# Patient Record
Sex: Female | Born: 1985 | Race: White | Hispanic: No | Marital: Married | Smoking: Never smoker
Health system: Southern US, Community
[De-identification: ages and names within clinical notes are randomized; demographics above are authoritative.]

## PROBLEM LIST (undated history)

## (undated) DIAGNOSIS — Z9884 Bariatric surgery status: Secondary | ICD-10-CM

## (undated) DIAGNOSIS — Z3490 Encounter for supervision of normal pregnancy, unspecified, unspecified trimester: Secondary | ICD-10-CM

## (undated) HISTORY — DX: Encounter for supervision of normal pregnancy, unspecified, unspecified trimester: Z34.90

## (undated) HISTORY — DX: Bariatric surgery status: Z98.84

## (undated) HISTORY — PX: TONSILLECTOMY: SUR1361

---

## 2009-05-15 ENCOUNTER — Other Ambulatory Visit: Admission: RE | Admit: 2009-05-15 | Discharge: 2009-05-15 | Payer: Self-pay | Admitting: Obstetrics and Gynecology

## 2009-07-16 ENCOUNTER — Ambulatory Visit (HOSPITAL_COMMUNITY): Admission: RE | Admit: 2009-07-16 | Discharge: 2009-07-16 | Payer: Self-pay | Admitting: General Surgery

## 2010-09-11 ENCOUNTER — Encounter: Payer: 59 | Attending: General Surgery | Admitting: *Deleted

## 2010-09-11 DIAGNOSIS — Z713 Dietary counseling and surveillance: Secondary | ICD-10-CM | POA: Insufficient documentation

## 2010-09-11 DIAGNOSIS — Z01818 Encounter for other preprocedural examination: Secondary | ICD-10-CM | POA: Insufficient documentation

## 2010-10-23 ENCOUNTER — Other Ambulatory Visit: Payer: Self-pay | Admitting: Adult Health

## 2010-10-23 ENCOUNTER — Other Ambulatory Visit (HOSPITAL_COMMUNITY)
Admission: RE | Admit: 2010-10-23 | Discharge: 2010-10-23 | Disposition: A | Discharge status: Home / Self Care | Payer: Self-pay | Source: Ambulatory Visit | Attending: Obstetrics and Gynecology | Admitting: Obstetrics and Gynecology

## 2010-10-23 DIAGNOSIS — Z01419 Encounter for gynecological examination (general) (routine) without abnormal findings: Secondary | ICD-10-CM | POA: Insufficient documentation

## 2010-10-23 DIAGNOSIS — Z113 Encounter for screening for infections with a predominantly sexual mode of transmission: Secondary | ICD-10-CM | POA: Insufficient documentation

## 2011-02-12 ENCOUNTER — Encounter: Payer: 59 | Attending: General Surgery

## 2011-02-12 DIAGNOSIS — Z713 Dietary counseling and surveillance: Secondary | ICD-10-CM | POA: Insufficient documentation

## 2011-02-12 DIAGNOSIS — Z09 Encounter for follow-up examination after completed treatment for conditions other than malignant neoplasm: Secondary | ICD-10-CM | POA: Insufficient documentation

## 2011-02-12 DIAGNOSIS — Z9884 Bariatric surgery status: Secondary | ICD-10-CM | POA: Insufficient documentation

## 2011-02-12 NOTE — Progress Notes (Signed)
  Pre-Operative Nutrition Class  Patient was seen on 02/12/11 for Pre-Operative Nutrition education at the [facility].   Surgery date: 02/13/2011 Surgery type: Gastric Bypass  Weight today: 316.8lbs Weight change: 8.1 lb gain Total weight lost: n/a BMI: 44.2%  Samples given per MNT protocol: Bariatric Advantage Multivitamin Lot # [id number] Exp:12/12  Bariatric Advantage Calcium Citrate Lot # [id number] Exp: 10/13  Celebrate Vitamins Multivitamin Lot # [id number] Exp:9/13  Celebrate Vitamins Calcium Citrate  Lot # [id number] Exp: 4/13  The following the learning objective met by your patient during this course:   Identifies Pre-Op Dietary Goals and will begin 2 weeks pre-operatively   Identifies appropriate sources of fluids and proteins   States protein recommendations and appropriate sources pre and post-operatively  Identifies Post-Operative Dietary Goals and will follow for 2 weeks post-operatively  Identifies appropriate multivitamin and calcium sources post-operatively  Describes the need for physical activity post-operatively and will follow MD recommendations  States when to call healthcare provider regarding medication questions or post-operative complications  Follow-Up Plan: Patient will follow-up at [facility] 2 weeks post operatively for diet advancement per MD.

## 2011-02-19 ENCOUNTER — Ambulatory Visit: Payer: 59

## 2011-02-26 ENCOUNTER — Encounter (INDEPENDENT_AMBULATORY_CARE_PROVIDER_SITE_OTHER): Payer: Self-pay | Admitting: General Surgery

## 2011-02-27 ENCOUNTER — Ambulatory Visit (INDEPENDENT_AMBULATORY_CARE_PROVIDER_SITE_OTHER): Payer: 59 | Admitting: General Surgery

## 2011-02-27 NOTE — Progress Notes (Signed)
Patient returns for a preop visit prior to scheduled laparoscopic Roux-en-Y gastric bypass. We reviewed all her studies. There were no concerns in the site for nutrition evaluation. Gallbladder ultrasound was negative. EKG and chest x-ray were unremarkable. Bone density was normal. Lab work was unremarkable .  We reviewed the consent form and all her questions were answered.

## 2011-02-27 NOTE — Patient Instructions (Signed)
Stick closely to the preop diet. Call as needed for questions.

## 2011-03-02 ENCOUNTER — Other Ambulatory Visit (INDEPENDENT_AMBULATORY_CARE_PROVIDER_SITE_OTHER): Payer: Self-pay | Admitting: General Surgery

## 2011-03-02 ENCOUNTER — Encounter (HOSPITAL_COMMUNITY): Payer: 59

## 2011-03-02 LAB — COMPREHENSIVE METABOLIC PANEL
ALT: 15 U/L (ref 0–35)
Albumin: 4 g/dL (ref 3.5–5.2)
Alkaline Phosphatase: 70 U/L (ref 39–117)
Chloride: 102 mEq/L (ref 96–112)
Potassium: 3.9 mEq/L (ref 3.5–5.1)
Sodium: 137 mEq/L (ref 135–145)
Total Bilirubin: 0.6 mg/dL (ref 0.3–1.2)
Total Protein: 7.9 g/dL (ref 6.0–8.3)

## 2011-03-02 LAB — CBC
HCT: 42.2 % (ref 36.0–46.0)
Hemoglobin: 14.2 g/dL (ref 12.0–15.0)
MCHC: 33.6 g/dL (ref 30.0–36.0)
RDW: 12.9 % (ref 11.5–15.5)
WBC: 7.2 10*3/uL (ref 4.0–10.5)

## 2011-03-02 LAB — DIFFERENTIAL
Basophils Absolute: 0 10*3/uL (ref 0.0–0.1)
Basophils Relative: 0 % (ref 0–1)
Lymphocytes Relative: 19 % (ref 12–46)
Monocytes Absolute: 0.5 10*3/uL (ref 0.1–1.0)
Monocytes Relative: 7 % (ref 3–12)
Neutro Abs: 5.2 10*3/uL (ref 1.7–7.7)
Neutrophils Relative %: 72 % (ref 43–77)

## 2011-03-09 ENCOUNTER — Inpatient Hospital Stay (HOSPITAL_COMMUNITY)
Admission: RE | Admit: 2011-03-09 | Discharge: 2011-03-11 | DRG: 621 | Disposition: A | Discharge status: Home / Self Care | Payer: 59 | Source: Ambulatory Visit | Attending: General Surgery | Admitting: General Surgery

## 2011-03-09 DIAGNOSIS — Z6841 Body Mass Index (BMI) 40.0 and over, adult: Secondary | ICD-10-CM

## 2011-03-09 DIAGNOSIS — Z01812 Encounter for preprocedural laboratory examination: Secondary | ICD-10-CM

## 2011-03-09 HISTORY — PX: OTHER SURGICAL HISTORY: SHX169

## 2011-03-09 HISTORY — PX: ROUX-EN-Y PROCEDURE: SUR1287

## 2011-03-09 LAB — HEMOGLOBIN AND HEMATOCRIT, BLOOD: HCT: 38.8 % (ref 36.0–46.0)

## 2011-03-10 ENCOUNTER — Inpatient Hospital Stay (HOSPITAL_COMMUNITY): Payer: 59

## 2011-03-10 LAB — CBC
HCT: 36.5 % (ref 36.0–46.0)
Hemoglobin: 12 g/dL (ref 12.0–15.0)
MCH: 29.5 pg (ref 26.0–34.0)
MCV: 89.7 fL (ref 78.0–100.0)
Platelets: 198 10*3/uL (ref 150–400)
RBC: 4.07 MIL/uL (ref 3.87–5.11)
WBC: 12 10*3/uL — ABNORMAL HIGH (ref 4.0–10.5)

## 2011-03-10 LAB — DIFFERENTIAL
Eosinophils Absolute: 0 10*3/uL (ref 0.0–0.7)
Lymphocytes Relative: 10 % — ABNORMAL LOW (ref 12–46)
Lymphs Abs: 1.2 10*3/uL (ref 0.7–4.0)
Monocytes Relative: 9 % (ref 3–12)
Neutro Abs: 9.6 10*3/uL — ABNORMAL HIGH (ref 1.7–7.7)
Neutrophils Relative %: 81 % — ABNORMAL HIGH (ref 43–77)

## 2011-03-10 MED ORDER — IOHEXOL 300 MG/ML  SOLN
50.0000 mL | Freq: Once | INTRAMUSCULAR | Status: AC | PRN
  Administered 2011-03-10: 50 mL via ORAL

## 2011-03-11 LAB — GLUCOSE, CAPILLARY
Glucose-Capillary: 106 mg/dL — ABNORMAL HIGH (ref 70–99)
Glucose-Capillary: 112 mg/dL — ABNORMAL HIGH (ref 70–99)

## 2011-03-11 LAB — DIFFERENTIAL
Basophils Absolute: 0 10*3/uL (ref 0.0–0.1)
Basophils Relative: 0 % (ref 0–1)
Eosinophils Absolute: 0.1 10*3/uL (ref 0.0–0.7)
Monocytes Relative: 11 % (ref 3–12)
Neutrophils Relative %: 69 % (ref 43–77)

## 2011-03-11 LAB — CBC
MCH: 29.9 pg (ref 26.0–34.0)
MCHC: 33 g/dL (ref 30.0–36.0)
Platelets: 162 10*3/uL (ref 150–400)
RBC: 3.88 MIL/uL (ref 3.87–5.11)

## 2011-03-15 NOTE — Op Note (Signed)
Rebecca [name], Rebecca [name]               ACCOUNT NO.:  [id number]  MEDICAL RECORD NO.:  [id number]  LOCATION:  1536                         FACILITY:  [facility], [place]  PHYSICIAN:  Sharlet Salina T. Beverlyann Broxterman, M.D.DATE OF BIRTH:  07/08/1986  DATE OF PROCEDURE:  03/09/2011 DATE OF DISCHARGE:                              OPERATIVE REPORT   PREOPERATIVE DIAGNOSIS:  Morbid obesity.  POSTOPERATIVE DIAGNOSIS:  Morbid obesity.  SURGICAL PROCEDURE:  Laparoscopic Roux-en-Y gastric bypass.  SURGEON:  Lorne Skeens. Iaan Oregel, M.D.  ASSISTANT:  Dr. Luretha Murphy.  ANESTHESIA:  General.  BRIEF HISTORY:  The patient is a 25 year old female with a history of progressive morbid obesity that has been unresponsive to multiple attempts with medical management.  She presents today with a weight of 306 pounds, BMI of 41.6 without other identified comorbidities.  After extensive preop discussion workup detailed elsewhere, we have elected proceed with laparoscopic Roux-en-Y gastric bypass for treatment of morbid obesity.  DESCRIPTION OF OPERATION:  The patient underwent mechanical bowel prep at home.  She was brought to the operating room, placed in supine position on the operating table, and general endotracheal anesthesia was induced.  She received preoperative broad-spectrum IV antibiotics and subcutaneous heparin.  PAS were in place.  Correct patient procedure were verified.  Local anesthesia was infiltrated at the trocar sites. Access was gained with a 12-mm Optiview trocar in the left subcostal space midclavicular line without difficulty and pneumoperitoneum established.  There was no evidence of trocar injury.  Under direct vision, a 12-mm trocar was placed laterally in the right upper quadrant, a 12-mm trocar in the right upper quadrant midclavicular line, and a 12- mm trocar just adjacent to the umbilicus on the left of the camera port. Additional 5-mm trocar was placed in the left flank.  Liver and bowel all  appeared normal.  The ligament of Treitz was identified and a 40 cm biliopancreatic limb was carefully measured.  At this point, the bowel was divided with a single firing of the white load Echelon 60-mm stapler.  The mesentery was divided the short distance further with the Harmonic scalpel.  A Penrose drain was sutured to the end of the Roux limb for later identification.  A 100-cm Roux limb was incidentally measured.  At this point, a side-to-side jejunostomy was created near the end of the biliopancreatic limb and the side of the Roux limb with single firing of the 45-mm white load stapler.  Staple line was intact without bleeding.  The common enterotomy was closed at either ends with a running 2-0 Vicryl.  The mesenteric defect was exposed, was closed with a running 2-0 silk.  Tisseel was used to close the suture staple line against the jejunostomy.  The patient was then placed in steep reverse Trendelenburg position and at 5-mm subxiphoid site, the [facility] retractor was placed, the left lobe of liver elevated with the exposure of the hiatus of the stomach.  Approximately 4 cm, the gastric pouch was measured along the lesser curve.  The peritoneum along the lesser curve was incised with a Harmonic scalpel and careful dissection was carried along the gastric wall at right angles toward the lesser sac and with dissection with Harmonic  scalpel and careful blunt dissection along the gastric wall of free lesser sac was entered.  The angle of His was mobilized, dividing the peritoneal attachments back toward the crus. With all tubes removed from the stomach, the initial firing across the stomach at right angle with the gold load 60-mm Echelon stapler was performed for about 4 cm.  Following this, 2 further blue load 6-mm stapler firings were used to create a tubular pouch, completed the pouch after dissecting the area of the angle of His.  There was a tiny outpouching of the gastric remnant  that appeared possibly devascularized, this was excised with a single firing of the 45-mm blue load stapler, and was removed.  A Ewald tube had been passed down through the EG junction prior to the final couple of firings to the pouch to protect this, remained widely patent.  The staple line of the gastric remnant was then oversewn with a running 2-0 silk.  Following this, the Roux limb was brought up in an antecolic fashion with candy cane facing the patient's left and there was no undue tension whatsoever.  The gastrojejunostomy was then created with an initial posterior row of running 2-0 Vicryl between the Roux limb and the staple line of the gastric pouch.  The Ewald tube was then passed down into the pouch, this was then extended into the pouch.  Enterotomies were created with the Harmonic scalpel, the pouch, and the Roux limb.  A 45-mm blue load stapler was then used to create approximately 2-2.5 cm anastomosis. The staple line was intact without bleeding.  The common enterotomy was then closed from either end with running 2-0 Vicryl.  Following this, Ewald tube was passed back down to the anastomosis, an outer row of running of 2-0 Vicryl seromuscular stitch was placed.  Anastomosis appeared obviously patent under no tension and viable.  The Ewald tube was removed.  Following this, Dr. Daphine Deutscher performed a upper endoscopy and under saline irrigation, with the pouch temporarily distended with air, there was no evidence of leak.  Pouch desufflated.  The scope removed. The Vonita Moss defect was closed suturing the mesentery of the transverse colon over the edge of the Roux limb mesentery with a running 2-0 silk, that was carefully inspected for bleeding or any evidence of trocar injury and everything looked fine.  The Nathan retractor was removed under direct vision after cutting the suture staple lines of the gastrojejunostomy with Tisseel.  All trocars removed after desufflating the  abdomen.  Skin incisions were closed with subcuticular Monocryl and Dermabond.  Sponge, needle, and instrument counts were correct.  The patient taken to Recovery in good condition.     Lorne Skeens. Arjan Strohm, M.D.     Tory Emerald  D:  03/09/2011  T:  03/10/2011  Job:  [id number]  Electronically Signed by Glenna Fellows M.D. on 03/15/2011 09:52:57 AM

## 2011-03-24 ENCOUNTER — Encounter: Payer: 59 | Attending: General Surgery | Admitting: *Deleted

## 2011-03-24 DIAGNOSIS — Z713 Dietary counseling and surveillance: Secondary | ICD-10-CM | POA: Insufficient documentation

## 2011-03-24 DIAGNOSIS — Z09 Encounter for follow-up examination after completed treatment for conditions other than malignant neoplasm: Secondary | ICD-10-CM | POA: Insufficient documentation

## 2011-03-24 DIAGNOSIS — Z9884 Bariatric surgery status: Secondary | ICD-10-CM | POA: Insufficient documentation

## 2011-03-24 NOTE — Patient Instructions (Signed)
Patient to follow Phase 3A-Soft, High Protein Diet and follow-up at [facility] in 6 weeks for 2 months post-op nutrition visit for diet advancement. 

## 2011-03-24 NOTE — Progress Notes (Signed)
  2 Week Post-Operative Nutrition Class  Patient was seen on 03/24/2011 for Post-Operative Nutrition education at the [facility].   Surgery date: 02/13/11 Surgery type: Gastric Bypass   Weight today: 283.8 lbs (316.8) Weight change: 33 lbs Total weight lost: 33 lbs total BMI: 39.7  The following the learning objective met the patient during this course:   Identifies Phase 3A (Soft, High Proteins) Dietary Goals and will begin from 2 weeks post-operatively to 2 months post-operatively   Identifies appropriate sources of fluids and proteins   States protein recommendations and appropriate sources post-operatively  Identifies the need for appropriate texture modifications, mastication, and bite sizes when consuming solids  Identifies appropriate multivitamin and calcium sources post-operatively  Describes the need for physical activity post-operatively and will follow MD recommendations  States when to call healthcare provider regarding medication questions or post-operative complications  Follow-Up Plan: Patient will follow-up at [facility] in 6 weeks for 2 months post-op nutrition visit for diet advancement per MD.

## 2011-03-26 ENCOUNTER — Encounter (INDEPENDENT_AMBULATORY_CARE_PROVIDER_SITE_OTHER): Payer: Self-pay | Admitting: General Surgery

## 2011-03-26 ENCOUNTER — Ambulatory Visit (INDEPENDENT_AMBULATORY_CARE_PROVIDER_SITE_OTHER): Payer: 59 | Admitting: General Surgery

## 2011-03-26 VITALS — BP 124/83 | HR 73 | Temp 98.8°F | Ht 72.0 in | Wt 283.4 lb

## 2011-03-26 DIAGNOSIS — Z9889 Other specified postprocedural states: Secondary | ICD-10-CM

## 2011-03-26 NOTE — Patient Instructions (Signed)
Gradually begin exercise. Call as needed for concerns.

## 2011-03-26 NOTE — Progress Notes (Signed)
Patient returns just over 2 weeks following laparoscopic Roux-en-Y gastric bypass for morbid obesity. She is getting along very well. She has just started her solid diet. She has not had any abdominal pain, vomiting, fever or other complaints. She is beginning her exercise and plans to go back to work next week.  On examination she is afebrile and vital signs are all within normal limits. She appears well. Abdomen is soft and nontender and wounds healing nicely.  She is doing very well postoperatively and will return in one month.

## 2011-04-10 NOTE — H&P (Signed)
  NAMECAYDEE, TALKINGTON               ACCOUNT NO.:  [id number]  MEDICAL RECORD NO.:  [id number]  LOCATION:                                 FACILITY:  PHYSICIAN:  Lorne Skeens. Raunel Dimartino, M.D.DATE OF BIRTH:  09/20/1985  DATE OF ADMISSION:  03/09/2011 DATE OF DISCHARGE:                             HISTORY & PHYSICAL   CHIEF COMPLAINT:  Morbid obesity.  HISTORY OF PRESENT ILLNESS:  The patient is a 25 year old female with a progressive history of morbid obesity, unresponsive to multiple efforts at medical management.  After a thorough evaluation and workup as an outpatient, she is admitted electively for laparoscopic Roux-en-Y gastric bypass.  PAST MEDICAL HISTORY:  Surgery significant only for tonsillectomy. Medically, she is not treated for any chronic illnesses.  MEDICATIONS:  None.  ALLERGIES:  None.  SOCIAL HISTORY:  She is working and going to school part-time.  No cigarettes or alcohol.  Single.  FAMILY HISTORY:  Father deceased, mother without serious illness.  REVIEW OF SYSTEMS:  Entirely negative except for some joint discomfort and fatigue.  PHYSICAL EXAM:  VITAL SIGNS:  She is 5 feet 11 inches, 317 pounds, BMI of 44.4.  Vital signs all within normal limits. GENERAL:  Significant for morbid obesity. SKIN:  Warm, dry.  No rash or infection. HEENT:  No masses or thyromegaly.  Sclerae nonicteric. LYMPH NODES:  No cervical, subclavicular or inguinal nodes palpable. LUNGS:  Clear without wheezing or increased work of breathing. CARDIOVASCULAR:  Regular rate and rhythm.  No murmurs. ABDOMEN:  Soft, nontender.  No masses.  No organomegaly.  No hernias. EXTREMITIES:  No joint swelling, deformity or edema.  ASSESSMENT AND PLAN:  25 year old female with progressive morbid obesity who is admitted electively for laparoscopic Roux-en-Y gastric bypass.     Lorne Skeens. Philip Kotlyar, M.D.     Tory Emerald  D:  03/31/2011  T:  03/31/2011  Job:  [id number]  Electronically Signed by  Glenna Fellows M.D. on 04/10/2011 05:51:17 PM

## 2011-04-10 NOTE — Discharge Summary (Signed)
  Rebecca [name], Rebecca [name]               ACCOUNT NO.:  [id number]  MEDICAL RECORD NO.:  [id number]  LOCATION:  1536                         FACILITY:  [facility]  PHYSICIAN:  Sharlet Salina T. Gerlene Glassburn, M.D.DATE OF BIRTH:  1986/05/10  DATE OF ADMISSION:  03/09/2011 DATE OF DISCHARGE:  03/11/2011                              DISCHARGE SUMMARY   DISCHARGE DIAGNOSIS:  Morbid obesity.  OPERATIONS AND PROCEDURES:  Laparoscopic Roux-en-Y gastric bypass, Dr. Johna Sheriff, March 09, 2011.  HISTORY:  This patient is an otherwise healthy morbidly obese 25 year old female electively admitted for laparoscopic Roux-en-Y gastric bypass.  PHYSICAL EXAMINATION:  Unremarkable except for morbid obesity.  HOSPITAL COURSE:  On the morning of her admission, she underwent uneventful laparoscopic Roux-en-Y gastric bypass.  Her hospital course was entirely uncomplicated.  Gastrografin swallow on the first postoperative day was unremarkable, and she was begun on oral fluids. On the second postoperative day, she was tolerating a protein shake. Abdomen is soft and nontender, wound is healing well.  Vital signs are stable.  She is discharged home at this time.  Discharge medications are Tylox as needed for pain.  Followup in my office in 2 to 3 weeks.     Lorne Skeens. Viha Kriegel, M.D.     Tory Emerald  D:  03/31/2011  T:  03/31/2011  Job:  [id number]  Electronically Signed by Glenna Fellows M.D. on 04/10/2011 05:51:09 PM

## 2011-05-05 ENCOUNTER — Encounter: Payer: 59 | Attending: General Surgery | Admitting: *Deleted

## 2011-05-05 DIAGNOSIS — Z09 Encounter for follow-up examination after completed treatment for conditions other than malignant neoplasm: Secondary | ICD-10-CM | POA: Insufficient documentation

## 2011-05-05 DIAGNOSIS — Z713 Dietary counseling and surveillance: Secondary | ICD-10-CM | POA: Insufficient documentation

## 2011-05-05 DIAGNOSIS — Z9884 Bariatric surgery status: Secondary | ICD-10-CM | POA: Insufficient documentation

## 2011-05-05 NOTE — Patient Instructions (Signed)
Goals:  Follow Phase 3B: High Protein + Non-Starchy Vegetables  Eat 3-6 small meals/snacks, every 3-5 hrs  Increase lean protein foods to meet 80g goal  Increase fluid intake to 64oz +  Avoid drinking 15 minutes before, during and 30 minutes after eating  Aim for >30 min of physical activity daily 

## 2011-05-05 NOTE — Progress Notes (Signed)
  Follow-up visit: 8 Weeks Post-Operative Gastric Bypass Surgery  Medical Nutrition Therapy:  Appt start time: 0730 end time:  0800.  Assessment:  Primary concerns today: post-operative bariatric surgery nutrition management. Rebecca [name] reports that she is doing well today with her nutrition s/p RNY GBP. She is eating small portions of protein 4-6 times per day. She notes that she is tired of some of eating the same foods and is ready for some variety in her diet.  Weight today: 264.3 lb Weight change: 19.5 lbs Total weight lost: 52.5 lbs total BMI: 36.9 Weight goal: 200-215  24-hr recall:  B (9 AM): Yogurt cup ([employer]) Snk (11  AM): String cheese   L (2 PM): [place] burger patty OR 1 egg w/ cheese (2-3 oz) Snk (3-4 PM): String cheese OR Babybel cheese  D (8:30 PM): 3 oz steak 1 oz mushrooms OR Salmon (3 oz) w/ green beans Snk (9 PM): SF popsickle  Fluid intake: water or crystal light (24-32 oz) Estimated total protein intake: 60-65g   Medications: Taking birth control Supplementation: Taking solid pills for MVI and taking liquid Calcium Citrate (1500mg )  Using straws: No Drinking while eating: No Hair loss: No Carbonated beverages: No N/V/D/C: No Dumping syndrome: No  Recent physical activity:  Walks 3 times/week for 45 minutes  Progress Towards Goal(s):  In progress.  Handouts given during visit include:  Phase 3B- High Protein and Non-Starchy Vegetables   Nutritional Diagnosis:  [place]-5.3 Inadequate protein intake As related to small portions .  As evidenced by pt meeting <75% of estimated protein requirements.    Intervention:  Nutrition education.  Monitoring/Evaluation:  Dietary intake, exercise, lap band fills, and body weight. Follow up in 4-6 weeks for 3-4 month post-op visit.

## 2011-05-07 ENCOUNTER — Ambulatory Visit (INDEPENDENT_AMBULATORY_CARE_PROVIDER_SITE_OTHER): Payer: 59 | Admitting: General Surgery

## 2011-05-07 ENCOUNTER — Encounter (INDEPENDENT_AMBULATORY_CARE_PROVIDER_SITE_OTHER): Payer: Self-pay | Admitting: General Surgery

## 2011-05-07 VITALS — BP 130/88 | HR 80 | Temp 98.8°F | Resp 16 | Ht 72.0 in | Wt 265.6 lb

## 2011-05-07 DIAGNOSIS — Z09 Encounter for follow-up examination after completed treatment for conditions other than malignant neoplasm: Secondary | ICD-10-CM

## 2011-05-07 NOTE — Progress Notes (Signed)
Patient returns for followup now just over 6 weeks following laparoscopic Roux-en-Y gastric bypass for morbid obesity. She continues to get along very well. She denies any abdominal or GI complaints. She is exercising regularly and has been advanced to solid high-protein diet and is tolerating this well.  On examination weight is 265 pounds representing a 40 pound weight loss from her surgical date. BMI is down to 35.9. Abdomen is soft and nontender. Wounds all well healed. She generally looks well.  Assessment and plan: Doing very well following laparoscopic Roux-en-Y gastric bypass. I'll see her back in another 6 weeks.

## 2011-06-16 ENCOUNTER — Encounter: Payer: 59 | Admitting: *Deleted

## 2011-06-17 ENCOUNTER — Encounter: Payer: 59 | Attending: General Surgery | Admitting: *Deleted

## 2011-06-17 ENCOUNTER — Encounter: Payer: Self-pay | Admitting: *Deleted

## 2011-06-17 DIAGNOSIS — Z09 Encounter for follow-up examination after completed treatment for conditions other than malignant neoplasm: Secondary | ICD-10-CM | POA: Insufficient documentation

## 2011-06-17 DIAGNOSIS — Z9884 Bariatric surgery status: Secondary | ICD-10-CM | POA: Insufficient documentation

## 2011-06-17 DIAGNOSIS — Z713 Dietary counseling and surveillance: Secondary | ICD-10-CM | POA: Insufficient documentation

## 2011-06-17 NOTE — Progress Notes (Signed)
   Follow-up visit: 12 Weeks Post-Operative Gastric Bypass Surgery  Medical Nutrition Therapy:  Appt start time: 0805 end time:  0835.  Assessment:  Primary concerns today: post-operative bariatric surgery nutrition management. Rebecca [name] has been doing well with nutrition s/p surgery. No nutrition related problems reported.  Weight today: 247.4 lbs Weight change: 16.9 Total weight lost: 69.4 lbs BMI: 33.6% Weight goal: 200-215 lbs  Surgery date: 02/13/11 Start weight at [facility]: 316.8 lbs  24-hr recall:  B (9-10 AM): Yogurt cup OR Ham steak w/ 1 egg white (2 oz total) OR Protein shake Snk (AM): No snacking   L (12-1 PM): Grilled chicken salad (light dressings) Snk (3-4 PM): 1/2 Protein bar, 1 string cheese  D (6-8 PM): Grilled chicken salad (light dressings) Snk (PM): SF popsicle  Fluid intake: water, coffee (splenda), protein shake = 48-64 oz Estimated total protein intake: 60-70 g  Medications: No changes Supplementation: Taking regularly, no problems reported  Using straws: No Drinking while eating: No Hair loss: No Carbonated beverages: No N/V/D/C: No Dumping syndrome: None reported  Recent physical activity:  Walking, 30 minutes, 3 times/week. Pt just joined the [employer].  Progress Towards Goal(s):  In progress.  Handouts given during visit include:  Carb Counting Card   Nutritional Diagnosis:  [place]-3.3 Overweight/obesity As related to recent gastric bypass surgery.  As evidenced by pt following gastric bypass nutrition guidelines for continued weight loss.    Intervention:  Nutrition education.  Monitoring/Evaluation:  Dietary intake, exercise, and body weight. Follow up in 3 months for 6 month post-op visit.

## 2011-06-17 NOTE — Patient Instructions (Signed)
Goals:  Follow Phase 3B: High Protein + Non-Starchy Vegetables  Eat 3-6 small meals/snacks, every 3-5 hrs  Increase lean protein foods to meet 60-80g goal  Increase fluid intake to 64oz +  Add 15 grams of carbohydrate (fruit, whole grain, starchy vegetable) with meals  Avoid drinking 15 minutes before, during and 30 minutes after eating  Aim for >30 min of physical activity daily  

## 2011-06-19 ENCOUNTER — Ambulatory Visit (INDEPENDENT_AMBULATORY_CARE_PROVIDER_SITE_OTHER): Payer: 59 | Admitting: General Surgery

## 2011-06-19 ENCOUNTER — Encounter (INDEPENDENT_AMBULATORY_CARE_PROVIDER_SITE_OTHER): Payer: Self-pay | Admitting: General Surgery

## 2011-06-19 DIAGNOSIS — K912 Postsurgical malabsorption, not elsewhere classified: Secondary | ICD-10-CM

## 2011-06-19 LAB — CBC WITH DIFFERENTIAL/PLATELET
Basophils Absolute: 0.1 10*3/uL (ref 0.0–0.1)
HCT: 42 % (ref 36.0–46.0)
Lymphocytes Relative: 20 % (ref 12–46)
Neutro Abs: 4.9 10*3/uL (ref 1.7–7.7)
Platelets: 223 10*3/uL (ref 150–400)
RBC: 4.55 MIL/uL (ref 3.87–5.11)
RDW: 15 % (ref 11.5–15.5)
WBC: 7.1 10*3/uL (ref 4.0–10.5)

## 2011-06-19 LAB — COMPREHENSIVE METABOLIC PANEL
ALT: 16 U/L (ref 0–35)
AST: 19 U/L (ref 0–37)
Albumin: 4.4 g/dL (ref 3.5–5.2)
Calcium: 9.5 mg/dL (ref 8.4–10.5)
Chloride: 105 mEq/L (ref 96–112)
Potassium: 4.6 mEq/L (ref 3.5–5.3)
Total Protein: 6.8 g/dL (ref 6.0–8.3)

## 2011-06-19 LAB — IRON AND TIBC: TIBC: 277 ug/dL (ref 250–470)

## 2011-06-19 LAB — VITAMIN B12: Vitamin B-12: 721 pg/mL (ref 211–911)

## 2011-06-19 NOTE — Progress Notes (Signed)
History: Patient returns for followup now 3 months post laparoscopic Roux-en-Y gastric bypass for morbid obesity. She reports she is doing well. Energy is good. She denies any abdominal pain or GI complaints. She has good progressive weight loss now over 60 pounds total for preop.  Exam: General he appears well. Abdomen is soft and nontender and wounds well healed without hernias. Weight is 243 pounds BMI 32.96.  Assessment and plan: Doing very well following laparoscopic Roux-en-Y gastric bypass without complication identified and good progressive weight loss. We reviewed her diet and exercise plans withdrawal appropriate. We'll obtain routine lab work today. Return in 3 months.

## 2011-06-23 ENCOUNTER — Telehealth (INDEPENDENT_AMBULATORY_CARE_PROVIDER_SITE_OTHER): Payer: Self-pay

## 2011-06-23 NOTE — Telephone Encounter (Signed)
Left message for patient

## 2011-09-17 ENCOUNTER — Encounter: Payer: Self-pay | Admitting: *Deleted

## 2011-09-17 ENCOUNTER — Encounter: Payer: 59 | Attending: General Surgery | Admitting: *Deleted

## 2011-09-17 VITALS — Ht 72.0 in | Wt 211.8 lb

## 2011-09-17 DIAGNOSIS — Z9884 Bariatric surgery status: Secondary | ICD-10-CM | POA: Insufficient documentation

## 2011-09-17 DIAGNOSIS — Z09 Encounter for follow-up examination after completed treatment for conditions other than malignant neoplasm: Secondary | ICD-10-CM | POA: Insufficient documentation

## 2011-09-17 DIAGNOSIS — Z713 Dietary counseling and surveillance: Secondary | ICD-10-CM | POA: Insufficient documentation

## 2011-09-17 NOTE — Progress Notes (Signed)
   Follow-up visit: 6 Months Post-Operative Gastric Bypass Surgery  Medical Nutrition Therapy:  Appt start time: 0810 end time:  0835.  Assessment:  Primary concerns today: post-operative bariatric surgery nutrition management. Rebecca [name] has been doing well with nutrition s/p surgery. No nutrition related problems reported.  Weight today: 211.8 lbs Weight change: 35.9 lbs Total weight lost: 105.3 lbs BMI: 28.8% Weight goal: 200-215 lbs  Surgery date: 02/13/11 Start weight at [facility]: 316.8 lbs  24-hr recall:  B (9-10 AM): Yogurt cup OR Ham steak w/ 1 egg white (2 oz total) OR Protein shake Snk (AM): No snacking   L (12-1 PM): Grilled chicken salad (light dressings) Snk (3-4 PM): 1/2 Protein bar, 1 string cheese  D (6-8 PM): Grilled chicken salad (light dressings) Snk (PM): SF popsicle  Fluid intake: water, coffee (splenda), protein shake = 48-64 oz Estimated total protein intake: 60-75 g  Medications: No changes Supplementation: Taking regularly, no problems reported  Using straws: No Drinking while eating: No Hair loss: Slightly increased Carbonated beverages: No N/V/D/C: Occasional constipation Dumping syndrome: None reported  Recent physical activity:  [employer]; 5-7 times/week for 60 minutes.  Progress Towards Goal(s):  In progress.   Nutritional Diagnosis:  [place]-3.3 Overweight/obesity As related to recent gastric bypass surgery.  As evidenced by pt following gastric bypass nutrition guidelines for continued weight loss.    Intervention:  Nutrition education.  Monitoring/Evaluation:  Dietary intake, exercise, and body weight. Follow up in 6 months for 12 month post-op visit.

## 2011-09-17 NOTE — Patient Instructions (Signed)
Goals:  Follow Phase 3B: High Protein + Non-Starchy Vegetables  Eat 3-6 small meals/snacks, every 3-5 hrs  Increase lean protein foods to meet 60-80g goal  Increase fluid intake to 64oz +  Add 15 grams of carbohydrate (fruit, whole grain, starchy vegetable) with meals  Avoid drinking 15 minutes before, during and 30 minutes after eating  Aim for >30 min of physical activity daily  

## 2011-10-08 ENCOUNTER — Ambulatory Visit (INDEPENDENT_AMBULATORY_CARE_PROVIDER_SITE_OTHER): Payer: 59 | Admitting: General Surgery

## 2011-11-26 ENCOUNTER — Ambulatory Visit (INDEPENDENT_AMBULATORY_CARE_PROVIDER_SITE_OTHER): Payer: 59 | Admitting: General Surgery

## 2011-12-02 ENCOUNTER — Encounter (INDEPENDENT_AMBULATORY_CARE_PROVIDER_SITE_OTHER): Payer: Self-pay | Admitting: General Surgery

## 2012-02-11 ENCOUNTER — Ambulatory Visit: Payer: 59 | Admitting: *Deleted

## 2012-11-23 ENCOUNTER — Telehealth: Payer: Self-pay | Admitting: Adult Health

## 2012-11-23 NOTE — Telephone Encounter (Signed)
Needs sample of birth control pills, will come by Friday.

## 2012-12-08 ENCOUNTER — Ambulatory Visit (INDEPENDENT_AMBULATORY_CARE_PROVIDER_SITE_OTHER): Payer: Self-pay | Admitting: General Surgery

## 2012-12-16 ENCOUNTER — Encounter (INDEPENDENT_AMBULATORY_CARE_PROVIDER_SITE_OTHER): Payer: Self-pay | Admitting: General Surgery

## 2012-12-27 IMAGING — CR Imaging study
1 series · 1 of 1 positions shown · IV contrast (agent unspecified)
Comparison: None.

CLINICAL DATA: Postop gastric bypass.

WATER SOLUBLE UPPER GI SERIES
TECHNIQUE: Single-column upper GI series was performed using water
soluble contrast.
Fluoroscopy Time: 2.9 minutes.
Contrast: 50 ml [id number].

[view not recorded]
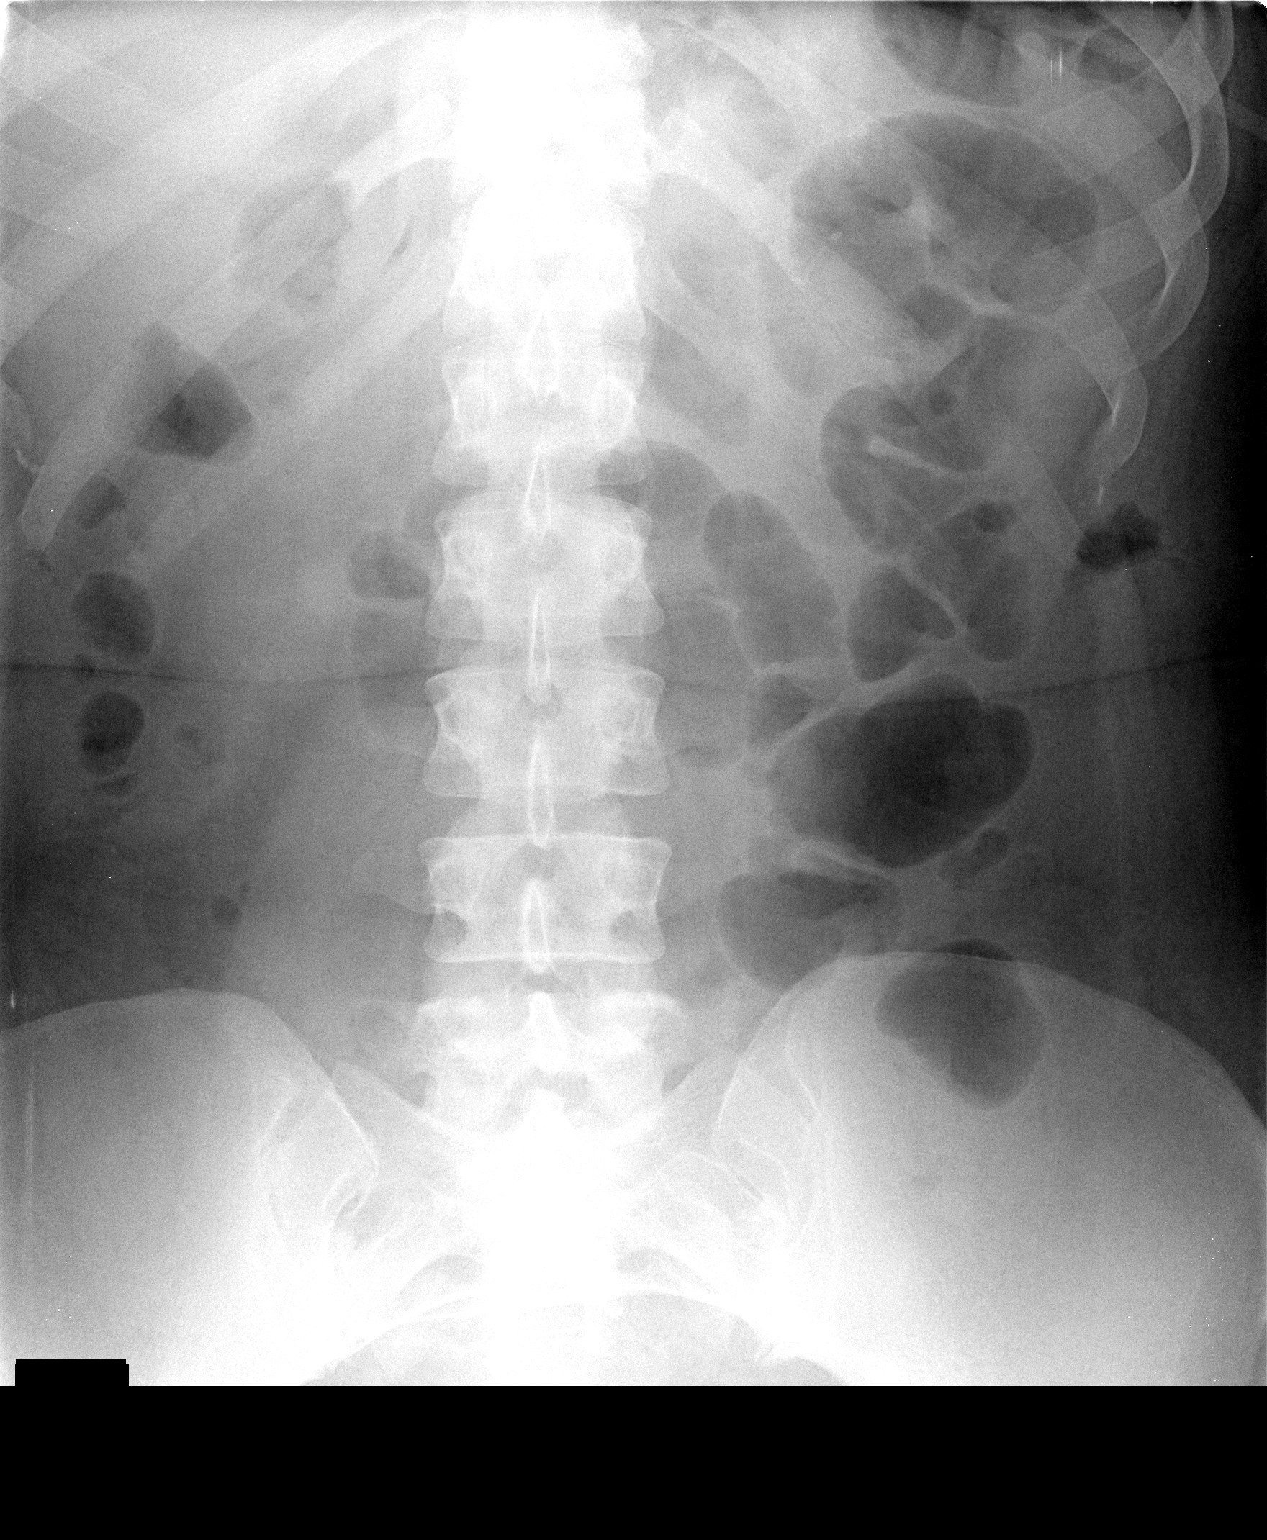

[1 of 1 positions shown; findings below may reference images not displayed]

FINDINGS: Water-soluble study demonstrates normal distal esophagus.
Small gastric pouch noted.  There is prompt emptying into the small
bowel.  No evidence of leak.  The study was continued distally show
no obstruction or leak at the distal anastomosis.
IMPRESSION: Normal postoperative appearance.  No obstruction or leak.

## 2013-02-06 ENCOUNTER — Other Ambulatory Visit: Payer: Self-pay

## 2013-02-06 ENCOUNTER — Emergency Department (HOSPITAL_COMMUNITY)
Admission: EM | Admit: 2013-02-06 | Discharge: 2013-02-06 | Disposition: A | Discharge status: Home / Self Care | Payer: Self-pay | Attending: Emergency Medicine | Admitting: Emergency Medicine

## 2013-02-06 ENCOUNTER — Encounter (HOSPITAL_COMMUNITY): Payer: Self-pay | Admitting: Emergency Medicine

## 2013-02-06 DIAGNOSIS — Z3202 Encounter for pregnancy test, result negative: Secondary | ICD-10-CM | POA: Insufficient documentation

## 2013-02-06 DIAGNOSIS — Z79899 Other long term (current) drug therapy: Secondary | ICD-10-CM | POA: Insufficient documentation

## 2013-02-06 DIAGNOSIS — E860 Dehydration: Secondary | ICD-10-CM | POA: Insufficient documentation

## 2013-02-06 DIAGNOSIS — R61 Generalized hyperhidrosis: Secondary | ICD-10-CM | POA: Insufficient documentation

## 2013-02-06 DIAGNOSIS — R55 Syncope and collapse: Secondary | ICD-10-CM | POA: Insufficient documentation

## 2013-02-06 DIAGNOSIS — Z9884 Bariatric surgery status: Secondary | ICD-10-CM | POA: Insufficient documentation

## 2013-02-06 LAB — COMPREHENSIVE METABOLIC PANEL
ALT: 20 U/L (ref 0–35)
AST: 21 U/L (ref 0–37)
Alkaline Phosphatase: 82 U/L (ref 39–117)
CO2: 27 mEq/L (ref 19–32)
Chloride: 103 mEq/L (ref 96–112)
Creatinine, Ser: 0.74 mg/dL (ref 0.50–1.10)
GFR calc non Af Amer: >90 mL/min (ref >90)
Total Bilirubin: 0.5 mg/dL (ref 0.3–1.2)

## 2013-02-06 LAB — URINALYSIS, ROUTINE W REFLEX MICROSCOPIC
Bilirubin Urine: NEGATIVE
Hgb urine dipstick: NEGATIVE
Nitrite: NEGATIVE
Specific Gravity, Urine: 1.015 (ref 1.005–1.030)
pH: 7.5 (ref 5.0–8.0)

## 2013-02-06 LAB — CBC WITH DIFFERENTIAL/PLATELET
Basophils Absolute: 0 10*3/uL (ref 0.0–0.1)
HCT: 43.3 % (ref 36.0–46.0)
Hemoglobin: 14.7 g/dL (ref 12.0–15.0)
Lymphocytes Relative: 32 % (ref 12–46)
Monocytes Absolute: 0.4 10*3/uL (ref 0.1–1.0)
Neutro Abs: 4.1 10*3/uL (ref 1.7–7.7)
RDW: 12.7 % (ref 11.5–15.5)
WBC: 6.7 10*3/uL (ref 4.0–10.5)

## 2013-02-06 LAB — PREGNANCY, URINE: Preg Test, Ur: NEGATIVE

## 2013-02-06 MED ORDER — SODIUM CHLORIDE 0.9 % IV BOLUS (SEPSIS): 1000.0000 mL | Freq: Once | INTRAVENOUS | Status: DC

## 2013-02-06 MED ORDER — SODIUM CHLORIDE 0.9 % IV BOLUS (SEPSIS)
1000.0000 mL | Freq: Once | INTRAVENOUS | Status: AC
  Administered 2013-02-06: 1000 mL via INTRAVENOUS

## 2013-02-06 NOTE — ED Notes (Signed)
Second 1 liter bag of saline given by Philmore Pali, RN at [phone]. Stopped at 1950.

## 2013-02-06 NOTE — ED Provider Notes (Signed)
History    This chart was scribed for Benny Lennert, MD, by Frederik Pear, ED scribe. The patient was seen in room APA12/APA12 and the patient's care was started at 1633.   CSN: [id number] Arrival date & time 02/06/13  1415  First MD Initiated Contact with Patient 02/06/13 1633     Chief Complaint  Patient presents with  . Dizziness  . Near Syncope   (Consider location/radiation/quality/duration/timing/severity/associated sxs/prior Treatment) Patient is a 27 y.o. female presenting with general illness. The history is provided by the patient and medical records. No language interpreter was used.  Illness Quality:  Dizziness Severity:  Unable to specify Onset quality:  Sudden Duration:  1 day Timing:  Intermittent Progression:  Unable to specify Associated symptoms: no abdominal pain, no chest pain, no congestion, no cough, no diarrhea, no fatigue, no headaches and no rash     HPI Comments: Rebecca [name] is a 27 y.o. female who presents to the Emergency Department complaining of sudden onset, intermittent dizziness that is aggravated by standing for long periods of time and alleviated by nothing with associated near-syncopal episodes and diaphoresis that began this morning. She reports 4 episodes since the symptom began including 2 after she arrived at work at approximately 0800. She denies SOB and cough. She reports she ate some trail mix before work. She has a h/o of gastric bypass, and her mother reports she has not taken her daily vitamins as prescribed for the last 6 months. No monthly period due to [facility].  PCP is Dr. Martyn Ehrich.   History reviewed. No pertinent past medical history. Past Surgical History  Procedure Laterality Date  . Tonsillectomy    . Rny  03/09/2011  . 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 History  Substance Use Topics  . Smoking status: Never Smoker   . Smokeless tobacco: Never Used  . Alcohol Use: 2.4 oz/week    4 Glasses of  wine per week     Comment: occ   OB History   Grav Para Term Preterm Abortions TAB SAB Ect Mult Living                 Review of Systems  Constitutional: Positive for diaphoresis. Negative for appetite change and fatigue.  HENT: Negative for congestion, sinus pressure and ear discharge.   Eyes: Negative for discharge.  Respiratory: Negative for cough.   Cardiovascular: Negative for chest pain.  Gastrointestinal: Negative for abdominal pain and diarrhea.  Genitourinary: Negative for frequency and hematuria.  Musculoskeletal: Negative for back pain.  Skin: Negative for rash.  Neurological: Positive for dizziness. Negative for seizures and headaches.       Near-syncopal episodes  Psychiatric/Behavioral: Negative for hallucinations.    Allergies  Review of patient's allergies indicates no known allergies.  Home Medications   Current Outpatient Rx  Name  Route  Sig  Dispense  Refill  . calcium citrate-vitamin D 200-200 MG-UNIT TABS   Oral   Take 4 tablets by mouth daily.           . cyanocobalamin 500 MCG tablet   Oral   Take 500 mcg by mouth daily.           . Multiple Vitamins-Minerals (MULTIVITAMIN WITH MINERALS) tablet   Oral   Take 1 tablet by mouth daily.           . norethindrone (MICRONOR,CAMILA,ERRIN) 0.35 MG tablet   Oral   Take 1 tablet by mouth  daily.            BP 113/62  Pulse 87  Temp(Src) 98.9 F (37.2 C)  Resp 18  Ht 6' (1.829 m)  Wt 200 lb (90.719 kg)  BMI 27.12 kg/m2  SpO2 100% Physical Exam  Nursing note and vitals reviewed. Constitutional: She is oriented to person, place, and time. She appears well-developed.  HENT:  Head: Normocephalic.  Eyes: Conjunctivae and EOM are normal. No scleral icterus.  Neck: Neck supple. No thyromegaly present.  Cardiovascular: Normal rate and regular rhythm.  Exam reveals no gallop and no friction rub.   No murmur heard. Pulmonary/Chest: No stridor. She has no wheezes. She has no rales. She  exhibits no tenderness.  Abdominal: She exhibits no distension. There is no tenderness. There is no rebound.  Musculoskeletal: Normal range of motion. She exhibits no edema.  Lymphadenopathy:    She has no cervical adenopathy.  Neurological: She is oriented to person, place, and time. Coordination normal.  Skin: No rash noted. No erythema.  Psychiatric: She has a normal mood and affect. Her behavior is normal.    ED Course  Procedures (including critical care time)  DIAGNOSTIC STUDIES: Oxygen Saturation is 100% on room air, normal by my interpretation.    COORDINATION OF CARE:  16:40- Discussed planned course of treatment with the patient, including IV fluids, EKG, CBC with differential, and comprehensive metabolic panel, who is agreeable at this time.  16:45- Medication Orders- sodium chloride 0.9% bolus 1,000 mL- once.   Results for orders placed during the hospital encounter of 02/06/13  CBC WITH DIFFERENTIAL      Result Value Range   WBC 6.7  4.0 - 10.5 K/uL   RBC 4.62  3.87 - 5.11 MIL/uL   Hemoglobin 14.7  12.0 - 15.0 g/dL   HCT 09.8  11.9 - 14.7 %   MCV 93.7  78.0 - 100.0 fL   MCH 31.8  26.0 - 34.0 pg   MCHC 33.9  30.0 - 36.0 g/dL   RDW 82.9  56.2 - 13.0 %   Platelets 199  150 - 400 K/uL   Neutrophils Relative % 61  43 - 77 %   Neutro Abs 4.1  1.7 - 7.7 K/uL   Lymphocytes Relative 32  12 - 46 %   Lymphs Abs 2.1  0.7 - 4.0 K/uL   Monocytes Relative 5  3 - 12 %   Monocytes Absolute 0.4  0.1 - 1.0 K/uL   Eosinophils Relative 1  0 - 5 %   Eosinophils Absolute 0.1  0.0 - 0.7 K/uL   Basophils Relative 0  0 - 1 %   Basophils Absolute 0.0  0.0 - 0.1 K/uL  COMPREHENSIVE METABOLIC PANEL      Result Value Range   Sodium 141  135 - 145 mEq/L   Potassium 3.9  3.5 - 5.1 mEq/L   Chloride 103  96 - 112 mEq/L   CO2 27  19 - 32 mEq/L   Glucose, Bld 98  70 - 99 mg/dL   BUN 13  6 - 23 mg/dL   Creatinine, Ser 8.65  0.50 - 1.10 mg/dL   Calcium 9.0  8.4 - 78.4 mg/dL   Total  Protein 6.7  6.0 - 8.3 g/dL   Albumin 3.7  3.5 - 5.2 g/dL   AST 21  0 - 37 U/L   ALT 20  0 - 35 U/L   Alkaline Phosphatase 82  39 - 117 U/L  Total Bilirubin 0.5  0.3 - 1.2 mg/dL   GFR calc non Af Amer >90  >90 mL/min   GFR calc Af Amer >90  >90 mL/min    Labs Reviewed - No data to display No results found. No diagnosis found.  MDM    The chart was scribed for me under my direct supervision.  I personally performed the history, physical, and medical decision making and all procedures in the evaluation of this patient.Benny Lennert, MD 02/06/13 [phone]

## 2013-02-06 NOTE — ED Notes (Signed)
Pt reports dizziness and near syncope today while at work, she has not been taking her daily vitamins as prescribed. Pt is alert in exam room,able to answer all ?'s, feels weak, and tired.

## 2013-06-01 ENCOUNTER — Telehealth: Payer: Self-pay | Admitting: *Deleted

## 2013-06-01 MED ORDER — NORETHIN-ETH ESTRAD-FE BIPHAS 1 MG-10 MCG / 10 MCG PO TABS: 1.0000 | ORAL_TABLET | Freq: Every day | ORAL | Status: DC

## 2013-06-01 NOTE — Telephone Encounter (Signed)
4 samples lo loestrin given lot [id number] A exp 10/15

## 2013-11-24 ENCOUNTER — Encounter (HOSPITAL_COMMUNITY): Payer: Self-pay | Admitting: Emergency Medicine

## 2013-11-24 ENCOUNTER — Emergency Department (HOSPITAL_COMMUNITY)
Admission: EM | Admit: 2013-11-24 | Discharge: 2013-11-24 | Disposition: A | Discharge status: Home / Self Care | Payer: Self-pay | Attending: Emergency Medicine | Admitting: Emergency Medicine

## 2013-11-24 ENCOUNTER — Emergency Department (HOSPITAL_COMMUNITY): Payer: Self-pay

## 2013-11-24 DIAGNOSIS — N390 Urinary tract infection, site not specified: Secondary | ICD-10-CM | POA: Insufficient documentation

## 2013-11-24 DIAGNOSIS — Z9884 Bariatric surgery status: Secondary | ICD-10-CM | POA: Insufficient documentation

## 2013-11-24 DIAGNOSIS — Z3202 Encounter for pregnancy test, result negative: Secondary | ICD-10-CM | POA: Insufficient documentation

## 2013-11-24 DIAGNOSIS — Z79899 Other long term (current) drug therapy: Secondary | ICD-10-CM | POA: Insufficient documentation

## 2013-11-24 DIAGNOSIS — R1013 Epigastric pain: Secondary | ICD-10-CM

## 2013-11-24 LAB — COMPREHENSIVE METABOLIC PANEL
ALBUMIN: 3.4 g/dL — AB (ref 3.5–5.2)
ALT: 16 U/L (ref 0–35)
AST: 19 U/L (ref 0–37)
Alkaline Phosphatase: 73 U/L (ref 39–117)
BUN: 11 mg/dL (ref 6–23)
CO2: 23 mEq/L (ref 19–32)
CREATININE: 0.75 mg/dL (ref 0.50–1.10)
Calcium: 8.9 mg/dL (ref 8.4–10.5)
Chloride: 104 mEq/L (ref 96–112)
GFR calc Af Amer: >90 mL/min (ref >90)
GFR calc non Af Amer: >90 mL/min (ref >90)
Glucose, Bld: 93 mg/dL (ref 70–99)
Potassium: 4 mEq/L (ref 3.7–5.3)
Sodium: 140 mEq/L (ref 137–147)
TOTAL PROTEIN: 6.2 g/dL (ref 6.0–8.3)
Total Bilirubin: 0.4 mg/dL (ref 0.3–1.2)

## 2013-11-24 LAB — CBC WITH DIFFERENTIAL/PLATELET
BASOS ABS: 0 10*3/uL (ref 0.0–0.1)
BASOS PCT: 0 % (ref 0–1)
EOS ABS: 0.2 10*3/uL (ref 0.0–0.7)
Eosinophils Relative: 2 % (ref 0–5)
HEMATOCRIT: 35.8 % — AB (ref 36.0–46.0)
HEMOGLOBIN: 12.1 g/dL (ref 12.0–15.0)
Lymphocytes Relative: 35 % (ref 12–46)
Lymphs Abs: 2.3 10*3/uL (ref 0.7–4.0)
MCH: 30.6 pg (ref 26.0–34.0)
MCHC: 33.8 g/dL (ref 30.0–36.0)
MCV: 90.4 fL (ref 78.0–100.0)
MONO ABS: 0.5 10*3/uL (ref 0.1–1.0)
MONOS PCT: 8 % (ref 3–12)
NEUTROS PCT: 55 % (ref 43–77)
Neutro Abs: 3.6 10*3/uL (ref 1.7–7.7)
Platelets: 200 10*3/uL (ref 150–400)
RBC: 3.96 MIL/uL (ref 3.87–5.11)
RDW: 13.1 % (ref 11.5–15.5)
WBC: 6.6 10*3/uL (ref 4.0–10.5)

## 2013-11-24 LAB — URINE MICROSCOPIC-ADD ON

## 2013-11-24 LAB — URINALYSIS, ROUTINE W REFLEX MICROSCOPIC
Bilirubin Urine: NEGATIVE
Glucose, UA: NEGATIVE mg/dL
Ketones, ur: 15 mg/dL — AB
NITRITE: NEGATIVE
PH: 6 (ref 5.0–8.0)
Protein, ur: NEGATIVE mg/dL
SPECIFIC GRAVITY, URINE: 1.021 (ref 1.005–1.030)
Urobilinogen, UA: 1 mg/dL (ref 0.0–1.0)

## 2013-11-24 LAB — POC URINE PREG, ED: PREG TEST UR: NEGATIVE

## 2013-11-24 LAB — LIPASE, BLOOD: LIPASE: 41 U/L (ref 11–59)

## 2013-11-24 MED ORDER — IOHEXOL 300 MG/ML  SOLN
50.0000 mL | Freq: Once | INTRAMUSCULAR | Status: AC | PRN
  Administered 2013-11-24: 50 mL via ORAL

## 2013-11-24 MED ORDER — GI COCKTAIL ~~LOC~~
30.0000 mL | Freq: Once | ORAL | Status: AC
  Administered 2013-11-24: 30 mL via ORAL
  Filled 2013-11-24: qty 30

## 2013-11-24 MED ORDER — SUCRALFATE 1 G PO TABS: 1.0000 g | ORAL_TABLET | Freq: Four times a day (QID) | ORAL | Status: DC | PRN

## 2013-11-24 MED ORDER — IOHEXOL 300 MG/ML  SOLN
100.0000 mL | Freq: Once | INTRAMUSCULAR | Status: AC | PRN
  Administered 2013-11-24: 100 mL via INTRAVENOUS

## 2013-11-24 MED ORDER — MORPHINE SULFATE 4 MG/ML IJ SOLN
4.0000 mg | Freq: Once | INTRAMUSCULAR | Status: AC
  Administered 2013-11-24: 4 mg via INTRAVENOUS
  Filled 2013-11-24: qty 1

## 2013-11-24 MED ORDER — NITROFURANTOIN MONOHYD MACRO 100 MG PO CAPS: 100.0000 mg | ORAL_CAPSULE | Freq: Two times a day (BID) | ORAL | Status: DC

## 2013-11-24 MED ORDER — PANTOPRAZOLE SODIUM 40 MG IV SOLR
40.0000 mg | Freq: Once | INTRAVENOUS | Status: AC
  Administered 2013-11-24: 40 mg via INTRAVENOUS
  Filled 2013-11-24: qty 40

## 2013-11-24 NOTE — Discharge Instructions (Signed)
Take prilosec once a day for the next 2 weeks.  You can supplement this medication with carafate as needed, up to four times a day.  You can also take tylenol, tums, rolaids.  Avoid alcohol, smoking, caffeine and anti-inflammatory medications such as ibuprofen, motrin, advil, aleve and aspirin. Follow up with Dr. Johna SheriffHoxworth.  You should return to the ER if you develop fever, worsening pain or uncontrolled vomiting.

## 2013-11-24 NOTE — ED Notes (Signed)
Pt does not have to urinate at this time.  

## 2013-11-24 NOTE — ED Notes (Signed)
Patient is alert and oriented x3.  She is complaining of mid abdominal pain that started last night. She currently rates her pain 5 of 10 without nausea.

## 2013-11-24 NOTE — ED Provider Notes (Signed)
CSN: [id number]     Arrival date & time 11/24/13  0129 History   First MD Initiated Contact with Patient 11/24/13 0159     Chief Complaint  Patient presents with  . Abdominal Pain     (Consider location/radiation/quality/duration/timing/severity/associated sxs/prior Treatment) HPI History provided by pt.   Pt presents w/ progressively worsening, non-radiating, cramping epigastric pain since 7pm yesterday.  No associated fever, chest pain, N/V, change in bowels, GU sx.  Aggravated by laying flat.  Did not take anything for pain.  Has never had pain like this in the past.  H/o roux-en-y in 2012.  Has not had caffeine, alcohol, NSAID, spicy food today. History reviewed. No pertinent past medical history. Past Surgical History  Procedure Laterality Date  . Tonsillectomy    . Rny  03/09/2011  . 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 No pertinent family history. History  Substance Use Topics  . Smoking status: Never Smoker   . Smokeless tobacco: Never Used  . Alcohol Use: 2.4 oz/week    4 Glasses of wine per week     Comment: occ   OB History   Grav Para Term Preterm Abortions TAB SAB Ect Mult Living                 Review of Systems  All other systems reviewed and are negative.     Allergies  Review of patient's allergies indicates no known allergies.  Home Medications   Prior to Admission medications   Medication Sig Start Date End Date Taking? Authorizing Provider  Norethindrone-Ethinyl Estradiol-Fe Biphas (LO LOESTRIN FE) 1 MG-10 MCG / 10 MCG tablet Take 1 tablet by mouth daily. 06/01/13  Yes Adline PotterJennifer A Griffin, NP   BP 136/79  Pulse 85  Temp(Src) 98.1 F (36.7 C) (Oral)  Resp 14  SpO2 100% Physical Exam  Nursing note and vitals reviewed. Constitutional: She is oriented to person, place, and time. She appears well-developed and well-nourished. No distress.  HENT:  Head: Normocephalic and atraumatic.  Eyes:  Normal appearance  Neck: Normal range of  motion.  Cardiovascular: Normal rate and regular rhythm.   Pulmonary/Chest: Effort normal and breath sounds normal. No respiratory distress.  Abdominal: Soft. Bowel sounds are normal. She exhibits no distension and no mass. There is no rebound and no guarding.  Epigastric ttp  Genitourinary:  No CVA tenderness  Musculoskeletal: Normal range of motion.  Neurological: She is alert and oriented to person, place, and time.  Skin: Skin is warm and dry. No rash noted.  Psychiatric: She has a normal mood and affect. Her behavior is normal.    ED Course  Procedures (including critical care time) Labs Review Labs Reviewed  CBC WITH DIFFERENTIAL - Abnormal; Notable for the following:    HCT 35.8 (*)    All other components within normal limits  COMPREHENSIVE METABOLIC PANEL - Abnormal; Notable for the following:    Albumin 3.4 (*)    All other components within normal limits  URINALYSIS, ROUTINE W REFLEX MICROSCOPIC - Abnormal; Notable for the following:    Hgb urine dipstick MODERATE (*)    Ketones, ur 15 (*)    Leukocytes, UA MODERATE (*)    All other components within normal limits  URINE MICROSCOPIC-ADD ON - Abnormal; Notable for the following:    Bacteria, UA FEW (*)    All other components within normal limits  LIPASE, BLOOD  POC URINE PREG, ED    Imaging Review Ct Abdomen Pelvis W Contrast  11/24/2013   CLINICAL DATA:  Mid abdominal pain. Roux-en-Y procedure 03/09/2011. Epigastric pain.  EXAM: CT ABDOMEN AND PELVIS WITH CONTRAST  TECHNIQUE: Multidetector CT imaging of the abdomen and pelvis was performed using the standard protocol following bolus administration of intravenous contrast.  CONTRAST:  100mL OMNIPAQUE IOHEXOL 300 MG/ML  SOLN  COMPARISON:  None.  FINDINGS: The lung bases are clear.  Postoperative changes in the upper abdomen consistent with gastric bypass procedure. The liver, spleen, gallbladder, pancreas, adrenal glands, kidneys, abdominal aorta, inferior vena cava,  and retroperitoneal lymph nodes are unremarkable. The stomach, small bowel, and colon are not abnormally distended and no discrete wall thickening is identified. No free air or free fluid in the abdomen.  Pelvis: The appendix is normal. There are a few prominent lymph nodes in the right lower quadrant which are likely reactive. Correlate clinically for possibility of mesenteric adenitis. No significant pelvic mass or lymphadenopathy. Uterus and ovaries are not enlarged. Small amount of free fluid in the pelvis is likely to be physiologic. No evidence of diverticulitis. No destructive bone lesions.  IMPRESSION: No acute process demonstrated in the abdomen or pelvis. Small amount of free fluid in the pelvis is likely physiologic. Nonspecific lymph nodes in the right lower quadrant are not pathologically enlarged under probably reactive. Postoperative changes consistent with gastric bypass surgery.   Electronically Signed   By: Burman NievesWilliam  Stevens M.D.   On: 11/24/2013 04:52     EKG Interpretation None      MDM   Final diagnoses:  Epigastric pain  Urinary tract infection    28yo F w/ h/o roux-en-y procedure in 2012, otherwise healthy, presents w/ progressively worsening epigastric pain since 7pm.  No associated sx.  On exam, afebrile, NAD, abd soft/non-distended, epigastric ttp w/out guarding/rebound.  Because of patient's surgical history, CT abd/pelvis ordered to r/o late post-op complication.  She declines pain medication at this time.    Labs unremarkable w/ exception of UTI.  CT non-acute.  All results discussed with patient.  D/c'd home w/ macrobid and carafate.  Recommended prilosec and tylenol as well.  Advised f/u w/ her general surgeon.  Return precautions discussed. 6:11 AM     Otilio Miuatherine E Jalaya Sarver, PA-C 11/24/13 [phone]

## 2013-11-24 NOTE — ED Provider Notes (Signed)
Medical screening examination/treatment/procedure(s) were performed by non-physician practitioner and as supervising physician I was immediately available for consultation/collaboration.   EKG Interpretation None        Hanley SeamenJohn L Laurey Salser, MD 11/24/13 [phone]

## 2013-11-24 NOTE — ED Notes (Signed)
Patient is alert and oriented x3.  She was given DC instructions and follow up visit instructions.  Patient gave verbal understanding. She was DC ambulatory under her own power to home.  V/S stable.  He was not showing any signs of distress on DC 

## 2015-11-18 ENCOUNTER — Encounter: Payer: Self-pay | Admitting: Physician Assistant

## 2015-11-18 ENCOUNTER — Ambulatory Visit (INDEPENDENT_AMBULATORY_CARE_PROVIDER_SITE_OTHER): Payer: Managed Care, Other (non HMO) | Admitting: Physician Assistant

## 2015-11-18 VITALS — BP 104/76 | HR 123 | Temp 98.5°F | Ht 72.0 in | Wt 252.4 lb

## 2015-11-18 DIAGNOSIS — Z1589 Genetic susceptibility to other disease: Secondary | ICD-10-CM | POA: Diagnosis not present

## 2015-11-18 DIAGNOSIS — K529 Noninfective gastroenteritis and colitis, unspecified: Secondary | ICD-10-CM

## 2015-11-18 NOTE — Progress Notes (Signed)
Pre visit review using our clinic review tool, if applicable. No additional management support is needed unless otherwise documented below in the visit note. 

## 2015-11-18 NOTE — Progress Notes (Signed)
   Patient presents to clinic today c/o 3 days of diarrhea with abdominal cramping and low-grade fever (99-100). Denies recent travel or sick contact. Denies abnormal food or water source. Endorses 4-5 loose stools the first couple of days, only 2-3 stools today. Stools or non-bloody. Denies tenesmus, nausea or vomiting. Denies heart burn or reflux. Is able to tolerate fluids but states she has not been hydrating as well as she should. Notes mild anorexia. Patient is s/p gastric bypass procedure in 2012. Denies noted weight loss with this illness.   No past medical history on file.  No current outpatient prescriptions on file prior to visit.   No current facility-administered medications on file prior to visit.    No Known Allergies  No family history on file.  Social History   Social History  . Marital Status: Single    Spouse Name: N/A  . Number of Children: N/A  . Years of Education: N/A   Social History Main Topics  . Smoking status: Never Smoker   . Smokeless tobacco: Never Used  . Alcohol Use: 2.4 oz/week    4 Glasses of wine per week     Comment: occ  . Drug Use: No  . Sexual Activity: Yes    Birth Control/ Protection: Pill   Other Topics Concern  . None   Social History Narrative   Review of Systems - See HPI.  All other ROS are negative.  BP 104/76 mmHg  Temp(Src) 98.5 F (36.9 C) (Oral)  Ht 6' (1.829 m)  Wt 252 lb 6 oz (114.477 kg)  BMI 34.22 kg/m2  SpO2 98%  LMP 10/22/2015  Physical Exam  Constitutional: She is oriented to person, place, and time and well-developed, well-nourished, and in no distress.  HENT:  Head: Normocephalic and atraumatic.  Mouth/Throat: Uvula is midline, oropharynx is clear and moist and mucous membranes are normal.  Eyes: Conjunctivae are normal. Pupils are equal, round, and reactive to light.  Neck: Neck supple.  Cardiovascular: Regular rhythm, normal heart sounds and intact distal pulses.   Pulmonary/Chest: Effort normal and  breath sounds normal. No respiratory distress. She has no wheezes. She has no rales. She exhibits no tenderness.  Abdominal: Normal appearance. Bowel sounds are hyperactive. There is no tenderness. No hernia.  Lymphadenopathy:    She has no cervical adenopathy.  Neurological: She is alert and oriented to person, place, and time.  Skin: Skin is warm and dry. No rash noted.  Psychiatric: Affect normal.  Vitals reviewed.  Assessment/Plan: 1. Gastroenteritis Symptoms improving. No fever for 24 hours without medication. Stools slowing down. Suspect viral etiology as symptoms improving quickly. Giving history of bypass and mild tachycardia will check CBC w diff and BMP today. Force fluids. Add on a G2 Gatorade or Pedialyte. BRAT diet reviewed. Alarm signs/symptoms discussed that would prompt ER evaluation.   - CBC w/Diff - Basic Metabolic Panel (BMET)

## 2015-11-18 NOTE — Patient Instructions (Signed)
Please go to the lab for blood work. I will call you with your results. Typically giving your good exam, no fever and improving symptoms, I would not worry about checking labs but your history of gastric bypass makes you more prone to anemias and electrolyte abnormalities.  Please increase fluids. I would even recommend a G2 Gatorade or a Pedialyte to help replenish electrolytes.  Please follow the diet below.  Call me if anything worsens or new symptoms develop

## 2015-11-19 LAB — CBC WITH DIFFERENTIAL/PLATELET
BASOS ABS: 0 10*3/uL (ref 0.0–0.1)
Basophils Relative: 0.2 % (ref 0.0–3.0)
EOS PCT: 1.9 % (ref 0.0–5.0)
Eosinophils Absolute: 0.1 10*3/uL (ref 0.0–0.7)
HCT: 36.7 % (ref 36.0–46.0)
HEMOGLOBIN: 12.1 g/dL (ref 12.0–15.0)
Lymphocytes Relative: 15.5 % (ref 12.0–46.0)
Lymphs Abs: 0.9 10*3/uL (ref 0.7–4.0)
MCHC: 33 g/dL (ref 30.0–36.0)
MCV: 83.1 fl (ref 78.0–100.0)
MONOS PCT: 10.3 % (ref 3.0–12.0)
Monocytes Absolute: 0.6 10*3/uL (ref 0.1–1.0)
Neutro Abs: 4 10*3/uL (ref 1.4–7.7)
Neutrophils Relative %: 72.1 % (ref 43.0–77.0)
Platelets: 196 10*3/uL (ref 150.0–400.0)
RBC: 4.41 Mil/uL (ref 3.87–5.11)
RDW: 15.6 % — ABNORMAL HIGH (ref 11.5–15.5)
WBC: 5.6 10*3/uL (ref 4.0–10.5)

## 2015-11-19 LAB — BASIC METABOLIC PANEL
BUN: 6 mg/dL (ref 6–23)
CALCIUM: 8.8 mg/dL (ref 8.4–10.5)
CHLORIDE: 109 meq/L (ref 96–112)
CO2: 24 meq/L (ref 19–32)
CREATININE: 0.72 mg/dL (ref 0.40–1.20)
GFR: 101.01 mL/min (ref >60.00)
GLUCOSE: 84 mg/dL (ref 70–99)
Potassium: 4.2 mEq/L (ref 3.5–5.1)
SODIUM: 138 meq/L (ref 135–145)

## 2015-12-09 ENCOUNTER — Ambulatory Visit: Payer: Managed Care, Other (non HMO) | Admitting: Physician Assistant

## 2016-08-26 ENCOUNTER — Encounter (HOSPITAL_COMMUNITY): Payer: Self-pay

## 2016-11-05 ENCOUNTER — Telehealth (HOSPITAL_COMMUNITY): Payer: Self-pay

## 2016-11-05 NOTE — Telephone Encounter (Signed)
This patient is overdue for recommended follow-up with a bariatric surgeon at [facility]. A letter was mailed to the address on file 08/26/16 from both [facility] & [facility] in attempt to reestablish post-op care. Letter has been returned to [employer] marked undeliverable, unable to forward. No additional address on file in [facility] or [employer]. Also, no response from patient following email attempt.  Information was shared with Dario Guardian today at [facility] so she may contact the patient via phone again in attempt to get the patient scheduled for an appointment in their office.

## 2017-03-29 ENCOUNTER — Ambulatory Visit (INDEPENDENT_AMBULATORY_CARE_PROVIDER_SITE_OTHER): Payer: Managed Care, Other (non HMO) | Admitting: Adult Health

## 2017-03-29 ENCOUNTER — Encounter: Payer: Self-pay | Admitting: Adult Health

## 2017-03-29 VITALS — BP 110/60 | HR 78 | Ht 72.0 in | Wt 249.0 lb

## 2017-03-29 DIAGNOSIS — Z3201 Encounter for pregnancy test, result positive: Secondary | ICD-10-CM

## 2017-03-29 DIAGNOSIS — O3680X0 Pregnancy with inconclusive fetal viability, not applicable or unspecified: Secondary | ICD-10-CM

## 2017-03-29 DIAGNOSIS — N926 Irregular menstruation, unspecified: Secondary | ICD-10-CM | POA: Diagnosis not present

## 2017-03-29 DIAGNOSIS — Z3490 Encounter for supervision of normal pregnancy, unspecified, unspecified trimester: Secondary | ICD-10-CM

## 2017-03-29 DIAGNOSIS — Z3A01 Less than 8 weeks gestation of pregnancy: Secondary | ICD-10-CM

## 2017-03-29 HISTORY — DX: Encounter for supervision of normal pregnancy, unspecified, unspecified trimester: Z34.90

## 2017-03-29 LAB — POCT URINE PREGNANCY: PREG TEST UR: POSITIVE — AB

## 2017-03-29 MED ORDER — OB COMPLETE PETITE 35-5-1-200 MG PO CAPS: ORAL_CAPSULE | ORAL | 12 refills | Status: DC

## 2017-03-29 NOTE — Progress Notes (Signed)
Subjective:     Patient ID: Rebecca [name], female   DOB: 1985-10-15, 31 y.o.   MRN: [id number]  HPI Rebecca [name] is a 31 year old white female, married in for UPT, has missed a period and had 7+HPT. She is sp gastric by pass.   Review of Systems +missed period with 7+HPT Reviewed past medical,surgical, social and family history. Reviewed medications and allergies.     Objective:   Physical Exam BP 110/60 (BP Location: Left Arm, Patient Position: Sitting, Cuff Size: Small)   Pulse 78   Ht 6' (1.829 m)   Wt 249 lb (112.9 kg)   LMP 02/14/2017   BMI 33.77 kg/m UPT +, about 6+1 week by LMP, EDD 11/22/17.Skin warm and dry. Neck: mid line trachea, normal thyroid, good ROM, no lymphadenopathy noted. Lungs: clear to ausculation bilaterally. Cardiovascular: regular rate and rhythm.Abdomen is soft and non tender PHQ 2 score 0.     Assessment:     1. Pregnancy test positive   2. Less than [redacted] weeks gestation of pregnancy   3. Encounter to determine fetal viability of pregnancy, single or unspecified fetus       Plan:     Meds ordered this encounter  Medications  . Prenat-FeCbn-FeAspGl-FA-Omega (OB COMPLETE PETITE) 35-5-1-200 MG CAPS    Sig: Take 1 daily    Dispense:  30 capsule    Refill:  12    Order Specific Question:   Supervising Provider    Answer:   Lazaro Arms [[id number]]  Return in 1 week for dating [place] Review handout on first trimester and by [facility]

## 2017-03-29 NOTE — Patient Instructions (Signed)
First Trimester of Pregnancy The first trimester of pregnancy is from week 1 until the end of week 13 (months 1 through 3). A week after a sperm fertilizes an egg, the egg will implant on the wall of the uterus. This embryo will begin to develop into a baby. Genes from you and your partner will form the baby. The female genes will determine whether the baby will be a boy or a girl. At 6-8 weeks, the eyes and face will be formed, and the heartbeat can be seen on ultrasound. At the end of 12 weeks, all the baby's organs will be formed. Now that you are pregnant, you will want to do everything you can to have a healthy baby. Two of the most important things are to get good prenatal care and to follow your health care provider's instructions. Prenatal care is all the medical care you receive before the baby's birth. This care will help prevent, find, and treat any problems during the pregnancy and childbirth. Body changes during your first trimester Your body goes through many changes during pregnancy. The changes vary from woman to woman.  You may gain or lose a couple of pounds at first.  You may feel sick to your stomach (nauseous) and you may throw up (vomit). If the vomiting is uncontrollable, call your health care provider.  You may tire easily.  You may develop headaches that can be relieved by medicines. All medicines should be approved by your health care provider.  You may urinate more often. Painful urination may mean you have a bladder infection.  You may develop heartburn as a result of your pregnancy.  You may develop constipation because certain hormones are causing the muscles that push stool through your intestines to slow down.  You may develop hemorrhoids or swollen veins (varicose veins).  Your breasts may begin to grow larger and become tender. Your nipples may stick out more, and the tissue that surrounds them (areola) may become darker.  Your gums may bleed and may be  sensitive to brushing and flossing.  Dark spots or blotches (chloasma, mask of pregnancy) may develop on your face. This will likely fade after the baby is born.  Your menstrual periods will stop.  You may have a loss of appetite.  You may develop cravings for certain kinds of food.  You may have changes in your emotions from day to day, such as being excited to be pregnant or being concerned that something may go wrong with the pregnancy and baby.  You may have more vivid and strange dreams.  You may have changes in your hair. These can include thickening of your hair, rapid growth, and changes in texture. Some women also have hair loss during or after pregnancy, or hair that feels dry or thin. Your hair will most likely return to normal after your baby is born.  What to expect at prenatal visits During a routine prenatal visit:  You will be weighed to make sure you and the baby are growing normally.  Your blood pressure will be taken.  Your abdomen will be measured to track your baby's growth.  The fetal heartbeat will be listened to between weeks 10 and 14 of your pregnancy.  Test results from any previous visits will be discussed.  Your health care provider may ask you:  How you are feeling.  If you are feeling the baby move.  If you have had any abnormal symptoms, such as leaking fluid, bleeding, severe headaches,   or abdominal cramping.  If you are using any tobacco products, including cigarettes, chewing tobacco, and electronic cigarettes.  If you have any questions.  Other tests that may be performed during your first trimester include:  Blood tests to find your blood type and to check for the presence of any previous infections. The tests will also be used to check for low iron levels (anemia) and protein on red blood cells (Rh antibodies). Depending on your risk factors, or if you previously had diabetes during pregnancy, you may have tests to check for high blood  sugar that affects pregnant women (gestational diabetes).  Urine tests to check for infections, diabetes, or protein in the urine.  An ultrasound to confirm the proper growth and development of the baby.  Fetal screens for spinal cord problems (spina bifida) and Down syndrome.  HIV (human immunodeficiency virus) testing. Routine prenatal testing includes screening for HIV, unless you choose not to have this test.  You may need other tests to make sure you and the baby are doing well.  Follow these instructions at home: Medicines  Follow your health care provider's instructions regarding medicine use. Specific medicines may be either safe or unsafe to take during pregnancy.  Take a prenatal vitamin that contains at least 600 micrograms (mcg) of folic acid.  If you develop constipation, try taking a stool softener if your health care provider approves. Eating and drinking  Eat a balanced diet that includes fresh fruits and vegetables, whole grains, good sources of protein such as meat, eggs, or tofu, and low-fat dairy. Your health care provider will help you determine the amount of weight gain that is right for you.  Avoid raw meat and uncooked cheese. These carry germs that can cause birth defects in the baby.  Eating four or five small meals rather than three large meals a day may help relieve nausea and vomiting. If you start to feel nauseous, eating a few soda crackers can be helpful. Drinking liquids between meals, instead of during meals, also seems to help ease nausea and vomiting.  Limit foods that are high in fat and processed sugars, such as fried and sweet foods.  To prevent constipation: ? Eat foods that are high in fiber, such as fresh fruits and vegetables, whole grains, and beans. ? Drink enough fluid to keep your urine clear or pale yellow. Activity  Exercise only as directed by your health care provider. Most women can continue their usual exercise routine during  pregnancy. Try to exercise for 30 minutes at least 5 days a week. Exercising will help you: ? Control your weight. ? Stay in shape. ? Be prepared for labor and delivery.  Experiencing pain or cramping in the lower abdomen or lower back is a good sign that you should stop exercising. Check with your health care provider before continuing with normal exercises.  Try to avoid standing for long periods of time. Move your legs often if you must stand in one place for a long time.  Avoid heavy lifting.  Wear low-heeled shoes and practice good posture.  You may continue to have sex unless your health care provider tells you not to. Relieving pain and discomfort  Wear a good support bra to relieve breast tenderness.  Take warm sitz baths to soothe any pain or discomfort caused by hemorrhoids. Use hemorrhoid cream if your health care provider approves.  Rest with your legs elevated if you have leg cramps or low back pain.  If you develop   varicose veins in your legs, wear support hose. Elevate your feet for 15 minutes, 3-4 times a day. Limit salt in your diet. Prenatal care  Schedule your prenatal visits by the twelfth week of pregnancy. They are usually scheduled monthly at first, then more often in the last 2 months before delivery.  Write down your questions. Take them to your prenatal visits.  Keep all your prenatal visits as told by your health care provider. This is important. Safety  Wear your seat belt at all times when driving.  Make a list of emergency phone numbers, including numbers for family, friends, the hospital, and police and fire departments. General instructions  Ask your health care provider for a referral to a local prenatal education class. Begin classes no later than the beginning of month 6 of your pregnancy.  Ask for help if you have counseling or nutritional needs during pregnancy. Your health care provider can offer advice or refer you to specialists for help  with various needs.  Do not use hot tubs, steam rooms, or saunas.  Do not douche or use tampons or scented sanitary pads.  Do not cross your legs for long periods of time.  Avoid cat litter boxes and soil used by cats. These carry germs that can cause birth defects in the baby and possibly loss of the fetus by miscarriage or stillbirth.  Avoid all smoking, herbs, alcohol, and medicines not prescribed by your health care provider. Chemicals in these products affect the formation and growth of the baby.  Do not use any products that contain nicotine or tobacco, such as cigarettes and e-cigarettes. If you need help quitting, ask your health care provider. You may receive counseling support and other resources to help you quit.  Schedule a dentist appointment. At home, brush your teeth with a soft toothbrush and be gentle when you floss. Contact a health care provider if:  You have dizziness.  You have mild pelvic cramps, pelvic pressure, or nagging pain in the abdominal area.  You have persistent nausea, vomiting, or diarrhea.  You have a bad smelling vaginal discharge.  You have pain when you urinate.  You notice increased swelling in your face, hands, legs, or ankles.  You are exposed to fifth disease or chickenpox.  You are exposed to German measles (rubella) and have never had it. Get help right away if:  You have a fever.  You are leaking fluid from your vagina.  You have spotting or bleeding from your vagina.  You have severe abdominal cramping or pain.  You have rapid weight gain or loss.  You vomit blood or material that looks like coffee grounds.  You develop a severe headache.  You have shortness of breath.  You have any kind of trauma, such as from a fall or a car accident. Summary  The first trimester of pregnancy is from week 1 until the end of week 13 (months 1 through 3).  Your body goes through many changes during pregnancy. The changes vary from  woman to woman.  You will have routine prenatal visits. During those visits, your health care provider will examine you, discuss any test results you may have, and talk with you about how you are feeling. This information is not intended to replace advice given to you by your health care provider. Make sure you discuss any questions you have with your health care provider. Document Released: 07/14/2001 Document Revised: 07/01/2016 Document Reviewed: 07/01/2016 Elsevier Interactive Patient Education  2017 [employer]   Inc[employer]  

## 2017-04-08 ENCOUNTER — Ambulatory Visit (INDEPENDENT_AMBULATORY_CARE_PROVIDER_SITE_OTHER): Payer: Managed Care, Other (non HMO)

## 2017-04-08 DIAGNOSIS — O3680X0 Pregnancy with inconclusive fetal viability, not applicable or unspecified: Secondary | ICD-10-CM

## 2017-04-08 DIAGNOSIS — Z3A01 Less than 8 weeks gestation of pregnancy: Secondary | ICD-10-CM

## 2017-04-08 NOTE — Progress Notes (Signed)
US 6+6 wks GS with ys,no fetal pole seen,normal ovaries bilat,pt will come back in 10 days for f/u ultrasound per Victorino DikeJennifer

## 2017-04-12 ENCOUNTER — Encounter: Payer: Self-pay | Admitting: Adult Health

## 2017-04-16 ENCOUNTER — Other Ambulatory Visit: Payer: Self-pay | Admitting: Obstetrics and Gynecology

## 2017-04-16 ENCOUNTER — Other Ambulatory Visit: Payer: Self-pay | Admitting: Adult Health

## 2017-04-16 DIAGNOSIS — O3680X0 Pregnancy with inconclusive fetal viability, not applicable or unspecified: Secondary | ICD-10-CM

## 2017-04-19 ENCOUNTER — Ambulatory Visit (INDEPENDENT_AMBULATORY_CARE_PROVIDER_SITE_OTHER): Payer: Managed Care, Other (non HMO)

## 2017-04-19 DIAGNOSIS — Z3A01 Less than 8 weeks gestation of pregnancy: Secondary | ICD-10-CM

## 2017-04-19 DIAGNOSIS — O3680X0 Pregnancy with inconclusive fetal viability, not applicable or unspecified: Secondary | ICD-10-CM

## 2017-04-19 NOTE — Progress Notes (Signed)
[place] 7+2 wks,single IUP w/ys,positive fht 138 bpm,normal ovaries bilat,crl 11.76 mm,EDD 12/04/2017 BY [place]

## 2017-05-03 ENCOUNTER — Encounter: Payer: Self-pay | Admitting: Women's Health

## 2017-05-03 ENCOUNTER — Ambulatory Visit (INDEPENDENT_AMBULATORY_CARE_PROVIDER_SITE_OTHER): Payer: Managed Care, Other (non HMO) | Admitting: Women's Health

## 2017-05-03 ENCOUNTER — Other Ambulatory Visit (HOSPITAL_COMMUNITY)
Admission: RE | Admit: 2017-05-03 | Discharge: 2017-05-03 | Disposition: A | Discharge status: Home / Self Care | Payer: Managed Care, Other (non HMO) | Source: Ambulatory Visit | Attending: Obstetrics & Gynecology | Admitting: Obstetrics & Gynecology

## 2017-05-03 ENCOUNTER — Ambulatory Visit: Payer: Managed Care, Other (non HMO) | Admitting: *Deleted

## 2017-05-03 VITALS — BP 120/78 | HR 84 | Wt 248.0 lb

## 2017-05-03 DIAGNOSIS — Z9884 Bariatric surgery status: Secondary | ICD-10-CM

## 2017-05-03 DIAGNOSIS — Z3682 Encounter for antenatal screening for nuchal translucency: Secondary | ICD-10-CM

## 2017-05-03 DIAGNOSIS — Z1389 Encounter for screening for other disorder: Secondary | ICD-10-CM

## 2017-05-03 DIAGNOSIS — Z3401 Encounter for supervision of normal first pregnancy, first trimester: Secondary | ICD-10-CM

## 2017-05-03 DIAGNOSIS — Z124 Encounter for screening for malignant neoplasm of cervix: Secondary | ICD-10-CM | POA: Diagnosis not present

## 2017-05-03 DIAGNOSIS — Z23 Encounter for immunization: Secondary | ICD-10-CM | POA: Diagnosis not present

## 2017-05-03 DIAGNOSIS — Z3A09 9 weeks gestation of pregnancy: Secondary | ICD-10-CM

## 2017-05-03 DIAGNOSIS — Z331 Pregnant state, incidental: Secondary | ICD-10-CM

## 2017-05-03 DIAGNOSIS — Z3400 Encounter for supervision of normal first pregnancy, unspecified trimester: Secondary | ICD-10-CM | POA: Insufficient documentation

## 2017-05-03 LAB — POCT URINALYSIS DIPSTICK
Blood, UA: NEGATIVE
GLUCOSE UA: NEGATIVE
KETONES UA: NEGATIVE
Leukocytes, UA: NEGATIVE
Nitrite, UA: NEGATIVE
Protein, UA: NEGATIVE

## 2017-05-03 NOTE — Patient Instructions (Signed)

## 2017-05-03 NOTE — Progress Notes (Signed)
[facility] Initial OB Visit  Patient name: Rebecca [name] MRN [id number]  Date of birth: 1985/10/24 CC & HPI:  Rebecca [name] is a 31 y.o. G1P0 Caucasian female at [redacted]w[redacted]d by 7wk u/s, with an Estimated Date of Delivery: 12/04/17 being seen today for her initial obstetrical visit. 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  Today she reports mild nausea- declines meds.  Review of Systems:   Denies cramping/contractions, leakage of fluid, vaginal bleeding, abnormal vaginal discharge w/ itching/odor/irritation, headaches, visual changes, shortness of breath, chest pain, abdominal pain, severe nausea/vomiting, or problems with urination or bowel movements.   Pertinent History Reviewed:   OB History  Gravida Para Term Preterm AB Living  1            SAB TAB Ectopic Multiple Live Births               # Outcome Date GA Lbr Len/2nd Weight Sex Delivery Anes PTL Lv  1 Current              Reviewed past medical,surgical and family history.  Reviewed problem list, medications and allergies.  Objective Findings:   Vitals:   05/03/17 1049  BP: 120/78  Pulse: 84  Weight: 248 lb (112.5 kg)    Body mass index is 33.63 kg/m.  Exam System:     General: Well developed & nourished, no acute distress   Skin: Warm & dry, normal coloration and turgor, no rashes   Neurologic: Alert & oriented, normal mood   Cardiovascular: Regular rate & rhythm   Respiratory: Effort & rate normal, LCTAB, acyanotic   Abdomen: Soft, non tender   Extremities: normal strength, tone   Pelvic Exam:    Perineum: Normal perineum   Vulva: Normal, no lesions   Vagina:  Normal mucosa, normal discharge   Cervix: Normal, bulbous, appears closed   Uterus: Normal size/shape/contour for GA   Pap smear: obtained w/ high risk HPV cotesting FHR: + via informal transabdominal u/s  Results for orders  placed or performed in visit on 05/03/17 (from the past 24 hour(s))  POCT urinalysis dipstick   Collection Time: 05/03/17 11:12 AM  Result Value Ref Range   Color, UA     Clarity, UA     Glucose, UA neg    Bilirubin, UA     Ketones, UA neg    Spec Grav, UA  1.010 - 1.025   Blood, UA neg    pH, UA  5.0 - 8.0   Protein, UA neg    Urobilinogen, UA  0.2 or 1.0 E.U./dL   Nitrite, UA neg    Leukocytes, UA Negative Negative     Assessment:   1) Low-Risk Pregnancy G1P0 at [redacted]w[redacted]d with an Estimated Date of Delivery: 12/04/17  2) Initial OB visit 3) H/O Roux-en-Y with 120lb wt loss  Plan:  Initial labs obtained Continue prenatal vitamins Discussed w/ LHE> pt is to resume all supplements she is supposed to be taking s/p Roux-en-Y Reviewed n/v relief measures and warning s/s to report Reviewed recommended weight gain based on pre-gravid BMI Encouraged well-balanced diet Genetic Screening discussed Integrated Screen: requested Cystic fibrosis screening discussed declined Ultrasound discussed; fetal survey: requested CCNC not completed>not applying for preg [place]  Flu shot today  Return in about 4 weeks (around 05/31/2017) for LROB, US:NT+1stIT.   Orders Placed This Encounter  Procedures  .  Urine Culture  . US Fetal Nuchal Translucency Measurement  . Flu Vaccine QUAD 36+ mos IM  . Pain Management Screening Profile (10S)  . CBC  . Hepatitis B surface antigen  . HIV antibody  . RPR  . Rubella screen  . Urinalysis, Routine w reflex microscopic  . Varicella zoster antibody, IgG  . POCT urinalysis dipstick  . ABO/Rh  . Antibody screen    Marge Duncans CNM, [facility] 05/03/2017 11:32 AM

## 2017-05-04 LAB — PMP SCREEN PROFILE (10S), URINE
Amphetamine Scrn, Ur: NEGATIVE ng/mL
BARBITURATE SCREEN URINE: NEGATIVE ng/mL
BENZODIAZEPINE SCREEN, URINE: NEGATIVE ng/mL
CANNABINOIDS UR QL SCN: NEGATIVE ng/mL
COCAINE(METAB.)SCREEN, URINE: NEGATIVE ng/mL
Creatinine(Crt), U: 28.1 mg/dL (ref 20.0–300.0)
Methadone Screen, Urine: NEGATIVE ng/mL
OPIATE SCREEN URINE: NEGATIVE ng/mL
OXYCODONE+OXYMORPHONE UR QL SCN: NEGATIVE ng/mL
Ph of Urine: 6.2 (ref 4.5–8.9)
Phencyclidine Qn, Ur: NEGATIVE ng/mL
Propoxyphene Scrn, Ur: NEGATIVE ng/mL

## 2017-05-04 LAB — MED LIST OPTION NOT SELECTED

## 2017-05-05 LAB — CYTOLOGY - PAP
Chlamydia: NEGATIVE
DIAGNOSIS: NEGATIVE
HPV (WINDOPATH): NOT DETECTED
NEISSERIA GONORRHEA: NEGATIVE

## 2017-05-05 LAB — URINE CULTURE: ORGANISM ID, BACTERIA: NO GROWTH

## 2017-05-20 ENCOUNTER — Encounter: Payer: Self-pay | Admitting: Women's Health

## 2017-05-20 DIAGNOSIS — O09899 Supervision of other high risk pregnancies, unspecified trimester: Secondary | ICD-10-CM | POA: Insufficient documentation

## 2017-05-20 DIAGNOSIS — Z2839 Other underimmunization status: Secondary | ICD-10-CM | POA: Insufficient documentation

## 2017-05-20 DIAGNOSIS — O9989 Other specified diseases and conditions complicating pregnancy, childbirth and the puerperium: Secondary | ICD-10-CM

## 2017-05-20 DIAGNOSIS — Z283 Underimmunization status: Secondary | ICD-10-CM | POA: Insufficient documentation

## 2017-05-20 LAB — URINALYSIS, ROUTINE W REFLEX MICROSCOPIC
Bilirubin, UA: NEGATIVE
GLUCOSE, UA: NEGATIVE
KETONES UA: NEGATIVE
Leukocytes, UA: NEGATIVE
NITRITE UA: NEGATIVE
Protein, UA: NEGATIVE
RBC, UA: NEGATIVE
SPEC GRAV UA: 1.005 (ref 1.005–1.030)
UUROB: 0.2 mg/dL (ref 0.2–1.0)
pH, UA: 7 (ref 5.0–7.5)

## 2017-05-20 LAB — RUBELLA SCREEN

## 2017-05-20 LAB — CBC
HEMOGLOBIN: 11.1 g/dL (ref 11.1–15.9)
Hematocrit: 35.7 % (ref 34.0–46.6)
MCH: 25.7 pg — AB (ref 26.6–33.0)
MCHC: 31.1 g/dL — ABNORMAL LOW (ref 31.5–35.7)
MCV: 83 fL (ref 79–97)
PLATELETS: 231 10*3/uL (ref 150–379)
RBC: 4.32 x10E6/uL (ref 3.77–5.28)
RDW: 16.4 % — ABNORMAL HIGH (ref 12.3–15.4)
WBC: 6.3 10*3/uL (ref 3.4–10.8)

## 2017-05-20 LAB — HIV ANTIBODY (ROUTINE TESTING W REFLEX): HIV Screen 4th Generation wRfx: NONREACTIVE

## 2017-05-20 LAB — ABO/RH: Rh Factor: POSITIVE

## 2017-05-20 LAB — ANTIBODY SCREEN: Antibody Screen: NEGATIVE

## 2017-05-20 LAB — RPR: RPR Ser Ql: NONREACTIVE

## 2017-05-20 LAB — VARICELLA ZOSTER ANTIBODY, IGG: VARICELLA: 518 {index} (ref >165)

## 2017-05-20 LAB — HEPATITIS B SURFACE ANTIGEN: Hepatitis B Surface Ag: NEGATIVE

## 2017-05-31 ENCOUNTER — Ambulatory Visit (INDEPENDENT_AMBULATORY_CARE_PROVIDER_SITE_OTHER): Payer: Managed Care, Other (non HMO) | Admitting: Obstetrics & Gynecology

## 2017-05-31 ENCOUNTER — Ambulatory Visit (INDEPENDENT_AMBULATORY_CARE_PROVIDER_SITE_OTHER): Payer: Managed Care, Other (non HMO)

## 2017-05-31 ENCOUNTER — Encounter: Payer: Self-pay | Admitting: Obstetrics & Gynecology

## 2017-05-31 VITALS — BP 118/72 | HR 71 | Wt 244.0 lb

## 2017-05-31 DIAGNOSIS — Z3402 Encounter for supervision of normal first pregnancy, second trimester: Secondary | ICD-10-CM

## 2017-05-31 DIAGNOSIS — Z1389 Encounter for screening for other disorder: Secondary | ICD-10-CM

## 2017-05-31 DIAGNOSIS — Z3A13 13 weeks gestation of pregnancy: Secondary | ICD-10-CM

## 2017-05-31 DIAGNOSIS — Z331 Pregnant state, incidental: Secondary | ICD-10-CM

## 2017-05-31 DIAGNOSIS — Z3682 Encounter for antenatal screening for nuchal translucency: Secondary | ICD-10-CM | POA: Diagnosis not present

## 2017-05-31 LAB — POCT URINALYSIS DIPSTICK
Glucose, UA: NEGATIVE
KETONES UA: NEGATIVE
NITRITE UA: NEGATIVE
PROTEIN UA: NEGATIVE
RBC UA: NEGATIVE

## 2017-05-31 NOTE — Progress Notes (Signed)
US 13+2 wks,measurements c/w dates,normal ovaries bilat,NT 1.8 mm,NB present,crl 72.41 mm,fhr 145 bpm

## 2017-05-31 NOTE — Progress Notes (Signed)
G1P0 3357w2d Estimated Date of Delivery: 12/04/17  Blood pressure 118/72, pulse 71, weight 244 lb (110.7 kg), last menstrual period 02/14/2017.   BP weight and urine results all reviewed and noted.  Please refer to the obstetrical flow sheet for the fundal height and fetal heart rate documentation:  Patient reports good fetal movement, denies any bleeding and no rupture of membranes symptoms or regular contractions. Patient is without complaints. All questions were answered.  Orders Placed This Encounter  Procedures  . Integrated 1  . POCT urinalysis dipstick    Plan:  Continued routine obstetrical care, NT normal, IT today  Return in about 4 weeks (around 06/28/2017) for LROB.

## 2017-06-03 LAB — INTEGRATED 1
Crown Rump Length: 72.4 mm
GEST. AGE ON COLLECTION DATE: 13 wk
MATERNAL AGE AT EDD: 32.2 a
NUCHAL TRANSLUCENCY (NT): 1.8 mm
NUMBER OF FETUSES: 1
PAPP-A Value: 434 ng/mL
Weight: 244 [lb_av]

## 2017-06-28 ENCOUNTER — Ambulatory Visit (INDEPENDENT_AMBULATORY_CARE_PROVIDER_SITE_OTHER): Payer: Managed Care, Other (non HMO) | Admitting: Obstetrics and Gynecology

## 2017-06-28 ENCOUNTER — Encounter: Payer: Self-pay | Admitting: Obstetrics and Gynecology

## 2017-06-28 VITALS — BP 118/60 | HR 98 | Wt 244.2 lb

## 2017-06-28 DIAGNOSIS — Z3A17 17 weeks gestation of pregnancy: Secondary | ICD-10-CM

## 2017-06-28 DIAGNOSIS — Z331 Pregnant state, incidental: Secondary | ICD-10-CM

## 2017-06-28 DIAGNOSIS — Z3402 Encounter for supervision of normal first pregnancy, second trimester: Secondary | ICD-10-CM

## 2017-06-28 DIAGNOSIS — Z1379 Encounter for other screening for genetic and chromosomal anomalies: Secondary | ICD-10-CM

## 2017-06-28 DIAGNOSIS — Z1389 Encounter for screening for other disorder: Secondary | ICD-10-CM

## 2017-06-28 LAB — POCT URINALYSIS DIPSTICK
GLUCOSE UA: NEGATIVE
Ketones, UA: NEGATIVE
Nitrite, UA: NEGATIVE
RBC UA: NEGATIVE

## 2017-06-28 NOTE — Progress Notes (Signed)
Patient ID: Rebecca [name], female   DOB: 11/01/1985, 31 y.o.   MRN: [id number] G1P0  [facility]ated [facility]: [facility]  Chief Complaint  Patient presents with  . Routine Prenatal Visit    2 nd IT  ____  Patient complaints: Rebecca [name] reports today for a routine prenatal visit and lab work. She is accompanied by her husband. This is the couples first baby and they plan to sign up for the childbirth classes.The patient reports good fetal movement, denies any bleeding, rupture of membranes,or regular contractions. She denies any other complaints at this time.   Last menstrual period 02/14/2017.   Urine results:notable for trace protein, 2+ leukocytes, otherwise negative refer to the ob flow sheet for FH and FHR, ,                          Physical Examination: General appearance - alert, well appearing, and in no distress and oriented to person, place, and time                                      Abdomen - FH                                                          -FHR 156                                            Questions were answered. Assessment: LROB G1P0 @ 7378w2d   Plan:  Continued routine obstetrical care,  F/u in 2 weeks for U/S Anatomy  By signing my name below, I, Diona BrownerJennifer Gorman, attest that this documentation has been prepared under the direction and in the presence of Tilda BurrowFerguson, Mikhaila Roh V, MD. Electronically Signed: Diona BrownerJennifer Gorman, Medical Scribe. 06/28/17. 10:56 AM.  I personally performed the services described in this documentation, which was SCRIBED in my presence. The recorded information has been reviewed and considered accurate. It has been edited as necessary during review. Tilda BurrowJohn V Debora Stockdale, MD

## 2017-06-28 NOTE — Patient Instructions (Signed)
(  [phone] is the phone number for Pregnancy Classes or hospital tours at [facility].  ° °You will be referred to  [email or web address]   for more information on childbirth classes   °At this site you may register for classes. You may sign up for a waiting list if classes are full. Please SIGN UP FOR THIS!.   When the waiting list becomes long, sometimes new classes can be added. ° ° ° °

## 2017-07-01 LAB — INTEGRATED 2
ADSF: 0.93
AFP MARKER: 17.8 ng/mL
AFP MOM: 0.77
CROWN RUMP LENGTH: 72.4 mm
DIA MOM: 0.5
DIA Value: 63.9 pg/mL
ESTRIOL UNCONJUGATED: 0.78 ng/mL
Gest. Age on Collection Date: 13 weeks
Gestational Age: 17 weeks
MATERNAL AGE AT EDD: 32.2 a
NUCHAL TRANSLUCENCY MOM: 1.01
NUMBER OF FETUSES: 1
Nuchal Translucency (NT): 1.8 mm
PAPP-A MoM: 0.71
PAPP-A VALUE: 434 ng/mL
TEST RESULTS: NEGATIVE
WEIGHT: 244 [lb_av]
Weight: 244 [lb_av]
hCG MoM: 0.55
hCG Value: 10.6 IU/mL

## 2017-07-09 ENCOUNTER — Other Ambulatory Visit: Payer: Self-pay | Admitting: Obstetrics and Gynecology

## 2017-07-09 DIAGNOSIS — Z363 Encounter for antenatal screening for malformations: Secondary | ICD-10-CM

## 2017-07-12 ENCOUNTER — Encounter: Payer: Managed Care, Other (non HMO) | Admitting: Women's Health

## 2017-07-12 ENCOUNTER — Other Ambulatory Visit: Payer: Managed Care, Other (non HMO)

## 2017-07-15 ENCOUNTER — Other Ambulatory Visit: Payer: Managed Care, Other (non HMO)

## 2017-07-15 ENCOUNTER — Encounter: Payer: Managed Care, Other (non HMO) | Admitting: Advanced Practice Midwife

## 2017-07-16 ENCOUNTER — Ambulatory Visit (INDEPENDENT_AMBULATORY_CARE_PROVIDER_SITE_OTHER): Payer: Managed Care, Other (non HMO)

## 2017-07-16 DIAGNOSIS — Z363 Encounter for antenatal screening for malformations: Secondary | ICD-10-CM

## 2017-07-16 DIAGNOSIS — Z3402 Encounter for supervision of normal first pregnancy, second trimester: Secondary | ICD-10-CM

## 2017-07-16 NOTE — Progress Notes (Signed)
US 19+6 wks,cephalic,cx 4 cm,normal ovaries bilat,posterior pl gr 0,svp of fluid 4.9 cm,LVEICF 1.4 mm,fhr 166 bpm,EFW 318 g,anatomy complete,no obvious abnormalities

## 2017-08-02 ENCOUNTER — Ambulatory Visit (INDEPENDENT_AMBULATORY_CARE_PROVIDER_SITE_OTHER): Payer: Managed Care, Other (non HMO) | Admitting: Obstetrics & Gynecology

## 2017-08-02 ENCOUNTER — Encounter: Payer: Self-pay | Admitting: Obstetrics & Gynecology

## 2017-08-02 VITALS — BP 112/70 | HR 98 | Wt 252.0 lb

## 2017-08-02 DIAGNOSIS — Z1389 Encounter for screening for other disorder: Secondary | ICD-10-CM

## 2017-08-02 DIAGNOSIS — Z331 Pregnant state, incidental: Secondary | ICD-10-CM

## 2017-08-02 DIAGNOSIS — Z3A22 22 weeks gestation of pregnancy: Secondary | ICD-10-CM

## 2017-08-02 DIAGNOSIS — Z3402 Encounter for supervision of normal first pregnancy, second trimester: Secondary | ICD-10-CM

## 2017-08-02 LAB — POCT URINALYSIS DIPSTICK
GLUCOSE UA: NEGATIVE
KETONES UA: NEGATIVE
Leukocytes, UA: NEGATIVE
NITRITE UA: NEGATIVE
RBC UA: NEGATIVE

## 2017-08-02 NOTE — Progress Notes (Signed)
G1P0 4064w2d Estimated Date of Delivery: 12/04/17  Blood pressure 112/70, pulse 98, weight 252 lb (114.3 kg), last menstrual period 02/14/2017.   BP weight and urine results all reviewed and noted.  Please refer to the obstetrical flow sheet for the fundal height and fetal heart rate documentation:  Patient reports good fetal movement, denies any bleeding and no rupture of membranes symptoms or regular contractions. Patient is without complaints. All questions were answered.  Orders Placed This Encounter  Procedures  . POCT urinalysis dipstick    Plan:  Continued routine obstetrical care, PN2 next visit Informed of isolated LV EICF, normal IT, no follow up is needed  Return in about 4 weeks (around 08/30/2017) for PN2, , LROB.

## 2017-08-03 NOTE — L&D Delivery Note (Signed)
Delivery Note At  a viable female was delivered via  (Presentation:vertex ; LOA ).  APGAR:8 ,9 ; weight  .   Placenta status:spont ,shultz .  Cord:3vc  with the following complications:none .  Cord pH: n/a  Anesthesia:  epidural Episiotomy:   none Lacerations:  2nd Suture Repair: 2.0 vicryl rapide Est. Blood Loss 100 (mL):    Mom to postpartum.  Baby to Couplet care / Skin to Skin.  Rebecca [name] 11/21/2017, 5:47 PM

## 2017-08-30 ENCOUNTER — Encounter (INDEPENDENT_AMBULATORY_CARE_PROVIDER_SITE_OTHER): Payer: Self-pay

## 2017-08-30 ENCOUNTER — Other Ambulatory Visit: Payer: Managed Care, Other (non HMO)

## 2017-08-30 ENCOUNTER — Ambulatory Visit (INDEPENDENT_AMBULATORY_CARE_PROVIDER_SITE_OTHER): Payer: Managed Care, Other (non HMO) | Admitting: Obstetrics and Gynecology

## 2017-08-30 ENCOUNTER — Encounter: Payer: Self-pay | Admitting: Obstetrics and Gynecology

## 2017-08-30 VITALS — BP 118/80 | HR 76 | Wt 258.6 lb

## 2017-08-30 DIAGNOSIS — Z3A26 26 weeks gestation of pregnancy: Secondary | ICD-10-CM

## 2017-08-30 DIAGNOSIS — Z1389 Encounter for screening for other disorder: Secondary | ICD-10-CM

## 2017-08-30 DIAGNOSIS — Z3402 Encounter for supervision of normal first pregnancy, second trimester: Secondary | ICD-10-CM

## 2017-08-30 DIAGNOSIS — Z331 Pregnant state, incidental: Secondary | ICD-10-CM

## 2017-08-30 LAB — POCT URINALYSIS DIPSTICK
Glucose, UA: NEGATIVE
KETONES UA: NEGATIVE
Leukocytes, UA: NEGATIVE
Nitrite, UA: NEGATIVE
Protein, UA: NEGATIVE
RBC UA: NEGATIVE

## 2017-08-30 NOTE — Progress Notes (Signed)
Patient ID: Rebecca [name], female   DOB: 09/17/1985, 32 y.o.   MRN: [id number]   LOW-RISK PREGNANCY VISIT Patient name: Rebecca [name] MRN [id number]  Date of birth: 12/19/1985 Chief Complaint:   Routine Prenatal Visit (PN2)  History of Present Illness:   Rebecca [name] is a 32 y.o. G1P0 female at 949w2d with an Estimated Date of Delivery: 12/04/17 being seen today for ongoing management of a low-risk pregnancy.  [name]. Contractions: Not present. Vag. Bleeding: None.  Movement: Present. denies leaking of fluid. Review of Systems:   Pertinent items are noted in HPI Denies abnormal vaginal discharge w/ itching/odor/irritation, headaches, visual changes, shortness of breath, chest pain, abdominal pain, severe nausea/vomiting, or problems with urination or bowel movements unless otherwise stated above. Pertinent History Reviewed:  Reviewed past medical,surgical, social, obstetrical and family history.  Reviewed problem list, medications and allergies. Physical Assessment:   Vitals:   08/30/17 0936  BP: 118/80  Pulse: 76  Weight: 258 lb 9.6 oz (117.3 kg)  Body mass index is 35.07 kg/m.        Physical Examination:   General appearance: Well appearing, and in no distress  Mental status: Alert, oriented to person, place, and time  Skin: Warm & dry  Cardiovascular: Normal heart rate noted  Respiratory: Normal respiratory effort, no distress  Abdomen: Soft, gravid, nontender  Pelvic: Cervical exam deferred         Extremities: Edema: None  Fetal Status: Fetal Heart Rate (bpm): 166 Fundal Height: 31 cm Movement: Present    Results for orders placed or performed in visit on 08/30/17 (from the past 24 hour(s))  POCT urinalysis dipstick   Collection Time: 08/30/17  9:39 AM  Result Value Ref Range   Color, UA     Clarity, UA     Glucose, UA neg    Bilirubin, UA     Ketones, UA neg    Spec Grav, UA  1.010 - 1.025   Blood, UA neg    pH, UA  5.0 - 8.0   Protein, UA  neg    Urobilinogen, UA  0.2 or 1.0 E.U./dL   Nitrite, UA neg    Leukocytes, UA Negative Negative   Appearance     Odor      Assessment & Plan:  1) Low-risk pregnancy G1P0 at 869w2d with an Estimated Date of Delivery: 12/04/17    Meds: No orders of the defined types were placed in this encounter.  Labs/procedures today: PN2  Plan:  Continue routine obstetrical care   Follow-up: Return in about 4 weeks (around 09/27/2017) for LROB.  Orders Placed This Encounter  Procedures  . POCT urinalysis dipstick   By signing my name below, I, Diona BrownerJennifer Gorman, attest that this documentation has been prepared under the direction and in the presence of Tilda BurrowFerguson, Seeley Hissong V, MD. Electronically Signed: Diona BrownerJennifer Gorman, Medical Scribe. 08/30/17. 10:02 AM.  I personally performed the services described in this documentation, which was SCRIBED in my presence. The recorded information has been reviewed and considered accurate. It has been edited as necessary during review. Tilda BurrowJohn V Josey Forcier, MD

## 2017-09-01 ENCOUNTER — Other Ambulatory Visit: Payer: Managed Care, Other (non HMO)

## 2017-09-02 ENCOUNTER — Other Ambulatory Visit: Payer: Self-pay | Admitting: Women's Health

## 2017-09-02 LAB — CBC
HEMATOCRIT: 32 % — AB (ref 34.0–46.6)
Hemoglobin: 10.4 g/dL — ABNORMAL LOW (ref 11.1–15.9)
MCH: 27.2 pg (ref 26.6–33.0)
MCHC: 32.5 g/dL (ref 31.5–35.7)
MCV: 84 fL (ref 79–97)
PLATELETS: 229 10*3/uL (ref 150–379)
RBC: 3.82 x10E6/uL (ref 3.77–5.28)
RDW: 15.1 % (ref 12.3–15.4)
WBC: 6.1 10*3/uL (ref 3.4–10.8)

## 2017-09-02 LAB — HIV ANTIBODY (ROUTINE TESTING W REFLEX): HIV SCREEN 4TH GENERATION: NONREACTIVE

## 2017-09-02 LAB — RPR: RPR Ser Ql: NONREACTIVE

## 2017-09-02 LAB — GLUCOSE TOLERANCE, 2 HOURS W/ 1HR
GLUCOSE, 2 HOUR: 46 mg/dL — AB (ref 65–152)
Glucose, 1 hour: 88 mg/dL (ref 65–179)
Glucose, Fasting: 78 mg/dL (ref 65–91)

## 2017-09-02 LAB — ANTIBODY SCREEN: Antibody Screen: NEGATIVE

## 2017-09-02 MED ORDER — FERROUS SULFATE 325 (65 FE) MG PO TABS: 325.0000 mg | ORAL_TABLET | Freq: Two times a day (BID) | ORAL | 3 refills | Status: DC

## 2017-09-03 ENCOUNTER — Telehealth: Payer: Self-pay | Admitting: *Deleted

## 2017-09-03 NOTE — Telephone Encounter (Signed)
Informed patient that she is anemic and prescription for iron was sent to pharmacy. Advised to take along with PNV and increase diet in iron rich foods. Verbalized understanding.

## 2017-09-27 ENCOUNTER — Ambulatory Visit (INDEPENDENT_AMBULATORY_CARE_PROVIDER_SITE_OTHER): Payer: Managed Care, Other (non HMO) | Admitting: Obstetrics & Gynecology

## 2017-09-27 ENCOUNTER — Encounter: Payer: Self-pay | Admitting: Obstetrics & Gynecology

## 2017-09-27 VITALS — BP 126/68 | HR 80 | Wt 264.0 lb

## 2017-09-27 DIAGNOSIS — Z1389 Encounter for screening for other disorder: Secondary | ICD-10-CM

## 2017-09-27 DIAGNOSIS — Z331 Pregnant state, incidental: Secondary | ICD-10-CM

## 2017-09-27 DIAGNOSIS — Z3403 Encounter for supervision of normal first pregnancy, third trimester: Secondary | ICD-10-CM

## 2017-09-27 DIAGNOSIS — Z3A30 30 weeks gestation of pregnancy: Secondary | ICD-10-CM

## 2017-09-27 LAB — POCT URINALYSIS DIPSTICK
Glucose, UA: NEGATIVE
Ketones, UA: NEGATIVE
Leukocytes, UA: NEGATIVE
NITRITE UA: NEGATIVE
Protein, UA: NEGATIVE
RBC UA: NEGATIVE

## 2017-09-27 NOTE — Progress Notes (Signed)
G1P0 9744w2d Estimated Date of Delivery: 12/04/17  Blood pressure 126/68, pulse 80, weight 264 lb (119.7 kg), last menstrual period 02/14/2017.   BP weight and urine results all reviewed and noted.  Please refer to the obstetrical flow sheet for the fundal height and fetal heart rate documentation:  Patient reports good fetal movement, denies any bleeding and no rupture of membranes symptoms or regular contractions. Patient is without complaints. All questions were answered.  Orders Placed This Encounter  Procedures  . POCT Urinalysis Dipstick    Plan:  Continued routine obstetrical care, normal exam  Return in about 2 weeks (around 10/11/2017) for LROB.

## 2017-10-11 ENCOUNTER — Other Ambulatory Visit: Payer: Self-pay

## 2017-10-11 ENCOUNTER — Ambulatory Visit (INDEPENDENT_AMBULATORY_CARE_PROVIDER_SITE_OTHER): Payer: Managed Care, Other (non HMO) | Admitting: Obstetrics & Gynecology

## 2017-10-11 ENCOUNTER — Encounter: Payer: Self-pay | Admitting: Obstetrics & Gynecology

## 2017-10-11 VITALS — BP 106/66 | HR 68 | Wt 267.0 lb

## 2017-10-11 DIAGNOSIS — Z3A32 32 weeks gestation of pregnancy: Secondary | ICD-10-CM

## 2017-10-11 DIAGNOSIS — Z331 Pregnant state, incidental: Secondary | ICD-10-CM

## 2017-10-11 DIAGNOSIS — Z3403 Encounter for supervision of normal first pregnancy, third trimester: Secondary | ICD-10-CM

## 2017-10-11 DIAGNOSIS — Z1389 Encounter for screening for other disorder: Secondary | ICD-10-CM

## 2017-10-11 LAB — POCT URINALYSIS DIPSTICK
Blood, UA: NEGATIVE
GLUCOSE UA: NEGATIVE
Ketones, UA: NEGATIVE
Nitrite, UA: NEGATIVE
PROTEIN UA: NEGATIVE

## 2017-10-11 NOTE — Progress Notes (Signed)
G1P0 10271w2d Estimated Date of Delivery: 12/04/17  Blood pressure 106/66, pulse 68, weight 267 lb (121.1 kg), last menstrual period 02/14/2017.   BP weight and urine results all reviewed and noted.  Please refer to the obstetrical flow sheet for the fundal height and fetal heart rate documentation:  Patient reports good fetal movement, denies any bleeding and no rupture of membranes symptoms or regular contractions. Patient is without complaints. All questions were answered.  Orders Placed This Encounter  Procedures  . POCT urinalysis dipstick    Plan:  Continued routine obstetrical care, some back issues local care reviewed: lacrosse baal to wall and danglers reviewed  Return in about 2 weeks (around 10/25/2017) for LROB.

## 2017-10-15 ENCOUNTER — Encounter (HOSPITAL_COMMUNITY): Payer: Self-pay

## 2017-10-25 ENCOUNTER — Other Ambulatory Visit: Payer: Self-pay

## 2017-10-25 ENCOUNTER — Ambulatory Visit (INDEPENDENT_AMBULATORY_CARE_PROVIDER_SITE_OTHER): Payer: Managed Care, Other (non HMO) | Admitting: Women's Health

## 2017-10-25 ENCOUNTER — Encounter: Payer: Self-pay | Admitting: Women's Health

## 2017-10-25 VITALS — BP 110/70 | HR 67 | Wt 270.0 lb

## 2017-10-25 DIAGNOSIS — Z1389 Encounter for screening for other disorder: Secondary | ICD-10-CM

## 2017-10-25 DIAGNOSIS — Z3403 Encounter for supervision of normal first pregnancy, third trimester: Secondary | ICD-10-CM

## 2017-10-25 DIAGNOSIS — Z331 Pregnant state, incidental: Secondary | ICD-10-CM

## 2017-10-25 DIAGNOSIS — Z3A34 34 weeks gestation of pregnancy: Secondary | ICD-10-CM

## 2017-10-25 LAB — POCT URINALYSIS DIPSTICK
GLUCOSE UA: NEGATIVE
KETONES UA: NEGATIVE
Leukocytes, UA: NEGATIVE
Nitrite, UA: NEGATIVE
Protein, UA: NEGATIVE
RBC UA: NEGATIVE

## 2017-10-25 NOTE — Patient Instructions (Addendum)
Rebecca [name], I greatly value your feedback.  If you receive a survey following your visit with [place] today, we appreciate you taking the time to fill it out.  Thanks, [name], CNM, WHNP-BC   Call the office [phone]) or go to [facility] if:  You begin to have strong, frequent contractions  Your water breaks.  Sometimes it is a big gush of fluid, sometimes it is just a trickle that keeps getting your panties wet or running down your legs  You have vaginal bleeding.  It is normal to have a small amount of spotting if your cervix was checked.   You don't feel your baby moving like normal.  If you don't, get you something to eat and drink and lay down and focus on feeling your baby move.  You should feel at least 10 movements in 2 hours.  If you don't, you should call the office or go to [facility].   Tdap Vaccine  It is recommended that you get the Tdap vaccine during the third trimester of EACH pregnancy to help protect your baby from getting pertussis (whooping cough)  27-36 weeks is the BEST time to do this so that you can pass the protection on to your baby. During pregnancy is better than after pregnancy, but if you are unable to get it during pregnancy it will be offered at the hospital.   You can get this vaccine at the health department or your family doctor  Everyone who will be around your baby should also be up-to-date on their vaccines. Adults (who are not pregnant) only need 1 dose of Tdap during adulthood.        Preterm Labor and Birth Information The normal length of a pregnancy is 39-41 weeks. Preterm labor is when labor starts before 37 completed weeks of pregnancy. What are the risk factors for preterm labor? Preterm labor is more likely to occur in women who:  Have certain infections during pregnancy such as a bladder infection, sexually transmitted infection, or infection inside the uterus (chorioamnionitis).  Have a shorter-than-normal  cervix.  Have gone into preterm labor before.  Have had surgery on their cervix.  Are younger than age 67 or older than age 47.  Are African American.  Are pregnant with twins or multiple babies (multiple gestation).  Take street drugs or smoke while pregnant.  Do not gain enough weight while pregnant.  Became pregnant shortly after having been pregnant.  What are the symptoms of preterm labor? Symptoms of preterm labor include:  Cramps similar to those that can happen during a menstrual period. The cramps may happen with diarrhea.  [name] in the abdomen or lower back.  Regular uterine contractions that may feel like tightening of the abdomen.  A feeling of increased pressure in the pelvis.  Increased watery or bloody mucus discharge from the vagina.  Water breaking (ruptured amniotic sac).  Why is it important to recognize signs of preterm labor? It is important to recognize signs of preterm labor because babies who are born prematurely may not be fully developed. This can put them at an increased risk for:  Long-term (chronic) heart and lung problems.  Difficulty immediately after birth with regulating body systems, including blood sugar, body temperature, heart rate, and breathing rate.  Bleeding in the brain.  Cerebral palsy.  Learning difficulties.  Death.  These risks are highest for babies who are born before 34 weeks of pregnancy. How is preterm labor treated? Treatment depends on  the length of your pregnancy, your condition, and the health of your baby. It may involve:  Having a stitch (suture) placed in your cervix to prevent your cervix from opening too early (cerclage).  Taking or being given medicines, such as: ? Hormone medicines. These may be given early in pregnancy to help support the pregnancy. ? Medicine to stop contractions. ? Medicines to help mature the baby's lungs. These may be prescribed if the risk of delivery is high. ? Medicines to  prevent your baby from developing cerebral palsy.  If the labor happens before 34 weeks of pregnancy, you may need to stay in the hospital. What should I do if I think I am in preterm labor? If you think that you are going into preterm labor, call your health care provider right away. How can I prevent preterm labor in future pregnancies? To increase your chance of having a full-term pregnancy:  Do not use any tobacco products, such as cigarettes, chewing tobacco, and e-cigarettes. If you need help quitting, ask your health care provider.  Do not use street drugs or medicines that have not been prescribed to you during your pregnancy.  Talk with your health care provider before taking any herbal supplements, even if you have been taking them regularly.  Make sure you gain a healthy amount of weight during your pregnancy.  Watch for infection. If you think that you might have an infection, get it checked right away.  Make sure to tell your health care provider if you have gone into preterm labor before.  This information is not intended to replace advice given to you by your health care provider. Make sure you discuss any questions you have with your health care provider. Document Released: 10/10/2003 Document Revised: 12/31/2015 Document Reviewed: 12/11/2015 Elsevier Interactive Patient Education  2018 [employer].

## 2017-10-25 NOTE — Progress Notes (Signed)
   LOW-RISK PREGNANCY VISIT Patient name: Rebecca [name] MRN [id number]  Date of birth: 07/13/1986 Chief Complaint:   Routine Prenatal Visit  History of Present Illness:   Rebecca [name] is a 32 y.o. G1P0 female at 1423w2d with an Estimated Date of Delivery: 12/04/17 being seen today for ongoing management of a low-risk pregnancy.  [name]. Contractions: Not present. Vag. Bleeding: None.  Movement: Present. denies leaking of fluid. Review of Systems:   Pertinent items are noted in HPI Denies abnormal vaginal discharge w/ itching/odor/irritation, headaches, visual changes, shortness of breath, chest pain, abdominal pain, severe nausea/vomiting, or problems with urination or bowel movements unless otherwise stated above. Pertinent History Reviewed:  Reviewed past medical,surgical, social, obstetrical and family history.  Reviewed problem list, medications and allergies. Physical Assessment:   Vitals:   10/25/17 1158  BP: 110/70  Pulse: 67  Weight: 270 lb (122.5 kg)  Body mass index is 36.62 kg/m.        Physical Examination:   General appearance: Well appearing, and in no distress  Mental status: Alert, oriented to person, place, and time  Skin: Warm & dry  Cardiovascular: Normal heart rate noted  Respiratory: Normal respiratory effort, no distress  Abdomen: Soft, gravid, nontender  Pelvic: Cervical exam deferred         Extremities: Edema: None  Fetal Status: Fetal Heart Rate (bpm): 138 Fundal Height: 34 cm Movement: Present    Results for orders placed or performed in visit on 10/25/17 (from the past 24 hour(s))  POCT urinalysis dipstick   Collection Time: 10/25/17 12:00 PM  Result Value Ref Range   Color, UA     Clarity, UA     Glucose, UA neg    Bilirubin, UA     Ketones, UA neg    Spec Grav, UA  1.010 - 1.025   Blood, UA neg    pH, UA  5.0 - 8.0   Protein, UA neg    Urobilinogen, UA  0.2 or 1.0 E.U./dL   Nitrite, UA neg    Leukocytes, UA Negative  Negative   Appearance     Odor      Assessment & Plan:  1) Low-risk pregnancy G1P0 at 6123w2d with an Estimated Date of Delivery: 12/04/17     Meds: No orders of the defined types were placed in this encounter.  Labs/procedures today: none  Plan:  Continue routine obstetrical care   Reviewed: Preterm labor symptoms and general obstetric precautions including but not limited to vaginal bleeding, contractions, leaking of fluid and fetal movement were reviewed in detail with the patient.  Recommended Tdap at HD/PCP per CDC guidelines. All questions were answered  Follow-up: Return in about 2 weeks (around 11/08/2017) for LROB.  Orders Placed This Encounter  Procedures  . POCT urinalysis dipstick   Cheral MarkerKimberly R Michiah Mudry CNM, [facility]NP-BC 10/25/2017 12:20 PM

## 2017-11-08 ENCOUNTER — Encounter: Payer: Self-pay | Admitting: Obstetrics and Gynecology

## 2017-11-08 ENCOUNTER — Other Ambulatory Visit: Payer: Self-pay

## 2017-11-08 ENCOUNTER — Ambulatory Visit (INDEPENDENT_AMBULATORY_CARE_PROVIDER_SITE_OTHER): Payer: Managed Care, Other (non HMO) | Admitting: Obstetrics and Gynecology

## 2017-11-08 VITALS — BP 116/72 | HR 61 | Wt 273.8 lb

## 2017-11-08 DIAGNOSIS — Z331 Pregnant state, incidental: Secondary | ICD-10-CM

## 2017-11-08 DIAGNOSIS — Z3403 Encounter for supervision of normal first pregnancy, third trimester: Secondary | ICD-10-CM

## 2017-11-08 DIAGNOSIS — Z3A36 36 weeks gestation of pregnancy: Secondary | ICD-10-CM

## 2017-11-08 DIAGNOSIS — Z1389 Encounter for screening for other disorder: Secondary | ICD-10-CM

## 2017-11-08 LAB — POCT URINALYSIS DIPSTICK
Glucose, UA: NEGATIVE
KETONES UA: NEGATIVE
Leukocytes, UA: NEGATIVE
NITRITE UA: NEGATIVE
PROTEIN UA: NEGATIVE
RBC UA: NEGATIVE

## 2017-11-08 NOTE — Progress Notes (Signed)
Patient ID: Rebecca [name], female   DOB: 03/13/1986, 32 y.o.   MRN: [id number]   LOW-RISK PREGNANCY VISIT Patient name: Rebecca [name] MRN [id number]  Date of birth: 07/30/1986 Chief Complaint:   Routine Prenatal Visit  History of Present Illness:   Rebecca [name] is a 32 y.o. G1P0 female at 559w2d with an Estimated Date of Delivery: 12/04/17 being seen today for ongoing management of a low-risk pregnancy.  Today she reports having a hard time staying comfortable when sleeping, otherwise she is doing well. Contractions: Not present. Vag. Bleeding: None.  Movement: Present. denies leaking of fluid. Review of Systems:   Pertinent items are noted in HPI attended childbirth classes, denies upper abdominal discomfort Denies abnormal vaginal discharge w/ itching/odor/irritation, headaches, visual changes, shortness of breath, chest pain, abdominal pain, severe nausea/vomiting, or problems with urination or bowel movements unless otherwise stated above. Pertinent History Reviewed:  Reviewed past medical,surgical, social, obstetrical and family history.  Reviewed problem list, medications and allergies. Physical Assessment:   Vitals:   11/08/17 1206  BP: 116/72  Pulse: 61  Weight: 273 lb 12.8 oz (124.2 kg)  Body mass index is 37.13 kg/m.        Physical Examination:   General appearance: Well appearing, and in no distress  Mental status: Alert, oriented to person, place, and time  Skin: Warm & dry  Cardiovascular: Normal heart rate noted  Respiratory: Normal respiratory effort, no distress  Abdomen: Soft, gravid, nontender fundal height 37 cm  Pelvic: Cervical exam deferred         Extremities: Edema: None  Fetal Status: Fetal Heart Rate (bpm): 141 Fundal Height: 37 cm Movement: Present    Results for orders placed or performed in visit on 11/08/17 (from the past 24 hour(s))  POCT urinalysis dipstick   Collection Time: 11/08/17 12:08 PM  Result Value Ref Range   Color, UA     Clarity, UA       Glucose, UA neg    Bilirubin, UA     Ketones, UA neg    Spec Grav, UA  1.010 - 1.025   Blood, UA neg    pH, UA  5.0 - 8.0   Protein, UA neg    Urobilinogen, UA  0.2 or 1.0 E.U./dL   Nitrite, UA neg    Leukocytes, UA Negative Negative   Appearance     Odor      Assessment & Plan:  1) Low-risk pregnancy G1P0 at [redacted]w[redacted]d with an Estimated Date of Delivery: 12/04/17    Meds: No orders of the defined types were placed in this encounter.  Labs/procedures today:   Plan:  Continue routine obstetrical care   Follow-up: Return in about 1 week (around 11/15/2017) for LROB, Group B, GC/ Chlamydia.  Orders Placed This Encounter  Procedures  . POCT urinalysis dipstick   By signing my name below, I, Diona BrownerJennifer Gorman, attest that this documentation has been prepared under the direction and in the presence of Tilda BurrowFerguson, Jaimya Feliciano V, MD. Electronically Signed: Diona BrownerJennifer Gorman, Medical Scribe. 11/08/17. 12:27 PM.  I personally performed the services described in this documentation, which was SCRIBED in my presence. The recorded information has been reviewed and considered accurate. It has been edited as necessary during review. Tilda BurrowJohn V Rumor Sun, MD

## 2017-11-15 ENCOUNTER — Encounter: Payer: Self-pay | Admitting: Obstetrics & Gynecology

## 2017-11-15 ENCOUNTER — Other Ambulatory Visit: Payer: Self-pay

## 2017-11-15 ENCOUNTER — Ambulatory Visit (INDEPENDENT_AMBULATORY_CARE_PROVIDER_SITE_OTHER): Payer: Managed Care, Other (non HMO) | Admitting: Obstetrics & Gynecology

## 2017-11-15 VITALS — BP 120/74 | HR 62 | Wt 274.0 lb

## 2017-11-15 DIAGNOSIS — Z3403 Encounter for supervision of normal first pregnancy, third trimester: Secondary | ICD-10-CM | POA: Diagnosis not present

## 2017-11-15 DIAGNOSIS — Z3A37 37 weeks gestation of pregnancy: Secondary | ICD-10-CM

## 2017-11-15 DIAGNOSIS — Z331 Pregnant state, incidental: Secondary | ICD-10-CM

## 2017-11-15 DIAGNOSIS — Z1389 Encounter for screening for other disorder: Secondary | ICD-10-CM

## 2017-11-15 LAB — POCT URINALYSIS DIPSTICK
GLUCOSE UA: NEGATIVE
Ketones, UA: NEGATIVE
Nitrite, UA: NEGATIVE
Protein, UA: NEGATIVE
RBC UA: NEGATIVE

## 2017-11-15 LAB — OB RESULTS CONSOLE GBS: STREP GROUP B AG: NEGATIVE

## 2017-11-15 NOTE — Progress Notes (Signed)
G1P0 7561w2d Estimated Date of Delivery: 12/04/17  Blood pressure 120/74, pulse 62, weight 274 lb (124.3 kg), last menstrual period 02/14/2017.   BP weight and urine results all reviewed and noted.  Please refer to the obstetrical flow sheet for the fundal height and fetal heart rate documentation:  Patient reports good fetal movement, denies any bleeding and no rupture of membranes symptoms or regular contractions. Patient is without complaints. All questions were answered.  Orders Placed This Encounter  Procedures  . GC/Chlamydia Probe Amp  . Culture, beta strep (group b only)  . POCT urinalysis dipstick    Plan:  Continued routine obstetrical care, cx 3/50/-2/soft/midplane  Return in about 1 week (around 11/22/2017) for LROB.

## 2017-11-17 LAB — GC/CHLAMYDIA PROBE AMP
CHLAMYDIA, DNA PROBE: NEGATIVE
NEISSERIA GONORRHOEAE BY PCR: NEGATIVE

## 2017-11-19 ENCOUNTER — Encounter (HOSPITAL_COMMUNITY): Payer: Self-pay | Admitting: *Deleted

## 2017-11-19 ENCOUNTER — Inpatient Hospital Stay (EMERGENCY_DEPARTMENT_HOSPITAL)
Admission: AD | Admit: 2017-11-19 | Discharge: 2017-11-19 | Disposition: A | Discharge status: Home / Self Care | Payer: Managed Care, Other (non HMO) | Source: Ambulatory Visit | Attending: Family Medicine | Admitting: Family Medicine

## 2017-11-19 ENCOUNTER — Other Ambulatory Visit: Payer: Self-pay

## 2017-11-19 DIAGNOSIS — Z3403 Encounter for supervision of normal first pregnancy, third trimester: Secondary | ICD-10-CM

## 2017-11-19 DIAGNOSIS — O4292 Full-term premature rupture of membranes, unspecified as to length of time between rupture and onset of labor: Secondary | ICD-10-CM | POA: Diagnosis not present

## 2017-11-19 LAB — COMPREHENSIVE METABOLIC PANEL
ALT: 14 U/L (ref 14–54)
ANION GAP: 8 (ref 5–15)
AST: 19 U/L (ref 15–41)
Albumin: 3 g/dL — ABNORMAL LOW (ref 3.5–5.0)
Alkaline Phosphatase: 86 U/L (ref 38–126)
BUN: 8 mg/dL (ref 6–20)
CHLORIDE: 108 mmol/L (ref 101–111)
CO2: 21 mmol/L — ABNORMAL LOW (ref 22–32)
Calcium: 8.5 mg/dL — ABNORMAL LOW (ref 8.9–10.3)
Creatinine, Ser: 0.74 mg/dL (ref 0.44–1.00)
GFR calc non Af Amer: >60 mL/min (ref >60)
Glucose, Bld: 87 mg/dL (ref 65–99)
POTASSIUM: 4.3 mmol/L (ref 3.5–5.1)
SODIUM: 137 mmol/L (ref 135–145)
Total Bilirubin: 0.5 mg/dL (ref 0.3–1.2)
Total Protein: 6.5 g/dL (ref 6.5–8.1)

## 2017-11-19 LAB — CBC
HCT: 35.6 % — ABNORMAL LOW (ref 36.0–46.0)
Hemoglobin: 11.4 g/dL — ABNORMAL LOW (ref 12.0–15.0)
MCH: 26.2 pg (ref 26.0–34.0)
MCHC: 32 g/dL (ref 30.0–36.0)
MCV: 81.8 fL (ref 78.0–100.0)
PLATELETS: 230 10*3/uL (ref 150–400)
RBC: 4.35 MIL/uL (ref 3.87–5.11)
RDW: 15.9 % — ABNORMAL HIGH (ref 11.5–15.5)
WBC: 7.8 10*3/uL (ref 4.0–10.5)

## 2017-11-19 LAB — PROTEIN / CREATININE RATIO, URINE
Creatinine, Urine: 177 mg/dL
Protein Creatinine Ratio: 0.08 mg/mg{Cre} (ref 0.00–0.15)
TOTAL PROTEIN, URINE: 14 mg/dL

## 2017-11-19 LAB — CULTURE, BETA STREP (GROUP B ONLY): Strep Gp B Culture: NEGATIVE

## 2017-11-19 NOTE — Progress Notes (Signed)
Pt informed she may ambulate, instructed to remain on the 4th floor and to return to unit @ 0830 or sooner if VB or ROM.  Pt verbalized understanding & agreeable.

## 2017-11-19 NOTE — Discharge Instructions (Signed)
Return to Maternity Admissions Unit for contractions that are 3 mins apart for 1 hour, vaginal bleeding, leaking of fluid, or decreased or no fetal movement.

## 2017-11-19 NOTE — MAU Note (Signed)
PT SAYS  UC STRONG -  WAS 3 CM IN OFFICE - DENIES HSV AND MRSA.  UNSURE OF GBS.

## 2017-11-19 NOTE — Progress Notes (Signed)
RN spoke to Dr. Primitivo GauzeFletcher regarding pt re eval.  States he will [facility] to [facility] Mathews RobinsonsHogan, CNM and call back.

## 2017-11-19 NOTE — MAU Provider Note (Signed)
History     CSN: [id number]  Arrival date and time: 11/19/17 8657  Provider initial contact with patient at 1225     Chief Complaint  Patient presents with  . Labor Eval   HPI  Ms.  Rebecca [name] is a 32 y.o. year old G1P0 female at [redacted]w[redacted]d weeks gestation who presents to MAU reporting strong contractions every 5 mins and was 3 cm in the office this week. While being evaluated for labor it was noted that she had elevated BPs. RN called L&D provider and was asked to have MAU provider evaluate pt for PEC.  MAU provider in to see patient. Pt has c/o H/A, swelling, RUQ or epigastric pain, or blurry vision. She denies any s/sx's of PEC.  Past Medical History:  Diagnosis Date  . Pregnant 03/29/2017  . S/P gastric bypass     Past Surgical History:  Procedure Laterality Date  . rny  03/09/2011  . ROUX-EN-Y PROCEDURE  03/09/11  . TONSILLECTOMY      Family History  Problem Relation Age of Onset  . Hypertension Maternal Grandmother   . Hypertension Maternal Grandfather   . Atrial fibrillation Maternal Grandfather     Social History   Tobacco Use  . Smoking status: Never Smoker  . Smokeless tobacco: Never Used  Substance Use Topics  . Alcohol use: No    Alcohol/week: 2.4 oz    Types: 4 Glasses of wine per week    Frequency: Never    Comment: occ before pregnancy  . Drug use: No    Allergies: No Known Allergies  Medications Prior to Admission  Medication Sig Dispense Refill Last Dose  . CALCIUM PO Take by mouth.   11/18/2017 at Unknown time  . Cyanocobalamin (VITAMIN B-12 PO) Take by mouth.   11/18/2017 at Unknown time  . ferrous sulfate 325 (65 FE) MG tablet Take 1 tablet (325 mg total) by mouth 2 (two) times daily with a meal. 60 tablet 3 11/18/2017 at Unknown time  . Prenat-FeCbn-FeAspGl-FA-Omega (OB COMPLETE PETITE) 35-5-1-200 MG CAPS Take 1 daily 30 capsule 12 11/18/2017 at Unknown time    Review of Systems  Constitutional: Negative.   HENT: Negative.   Eyes:  Negative.   Respiratory: Negative.   Cardiovascular: Negative.   Gastrointestinal: Positive for abdominal pain (UC's every 5 mins).  Endocrine: Negative.   Genitourinary: Positive for pelvic pain (UC's).  Musculoskeletal: Negative.   Skin: Negative.   Allergic/Immunologic: Negative.   Neurological: Negative.   Hematological: Negative.   Psychiatric/Behavioral: Negative.    Physical Exam   Patient Vitals for the past 24 hrs:  BP Temp Temp src Pulse Resp SpO2 Height Weight  11/19/17 1154 (!) 135/93 98.3 F (36.8 C) Oral 94 18 - - -  11/19/17 1146 (!) 122/93 - - 91 - - - -  11/19/17 1131 128/84 - - 83 - - - -  11/19/17 1116 133/77 - - 83 - - - -  11/19/17 1101 139/79 - - 80 - - - -  11/19/17 1016 123/77 - - 85 - - - -  11/19/17 1000 (!) 130/93 - - 86 - - - -  11/19/17 0958 (!) 128/95 - - 81 - - - -  11/19/17 0849 122/85 - - 74 - - - -  11/19/17 0732 (!) 119/94 - - 90 - - - -  11/19/17 0727 - - - - - 100 % - -  11/19/17 0719 - - - - - 98 % - -  11/19/17 0717 123/88 - - 92 - - - -  11/19/17 0715 - - - - - 99 % - -  11/19/17 0708 (!) 127/97 98.3 F (36.8 C) Oral 95 20 100 % - -  11/19/17 0703 - - - - - - 6\' 1"  (1.854 m) 275 lb 4 oz (124.9 kg)   Physical Exam  Nursing note and vitals reviewed. Constitutional: She is oriented to person, place, and time. She appears well-developed and well-nourished.  HENT:  Head: Normocephalic and atraumatic.  Eyes: Pupils are equal, round, and reactive to light.  Neck: Normal range of motion.  Cardiovascular: Normal rate, regular rhythm and normal heart sounds.  Respiratory: Effort normal and breath sounds normal.  GI: Soft. Bowel sounds are normal.  Genitourinary:  Genitourinary Comments: VE by RN  Musculoskeletal: Normal range of motion.  Very large BLE -- not d/t swelling, both pt and spouse verify the size is her normal size legs  Neurological: She is alert and oriented to person, place, and time.  Skin: Skin is warm and dry.   Psychiatric: She has a normal mood and affect. Her behavior is normal. Judgment and thought content normal.   Dilation: 4 Effacement (%): 70 Station: -2 Presentation: Vertex Exam by:: F. Morris, RNC   MAU Course  Procedures  MDM CBC CMP P/C Ratio NST - FHR: 135 bpm / moderate variability / accels present / decels absent / TOCO: regular every 1.5-4 mins  Results for orders placed or performed during the hospital encounter of 11/19/17 (from the past 24 hour(s))  Protein / creatinine ratio, urine     Status: None   Collection Time: 11/19/17  9:34 AM  Result Value Ref Range   Creatinine, Urine 177.00 mg/dL   Total Protein, Urine 14 mg/dL   Protein Creatinine Ratio 0.08 0.00 - 0.15 mg/mg[Cre]  CBC     Status: Abnormal   Collection Time: 11/19/17  9:54 AM  Result Value Ref Range   WBC 7.8 4.0 - 10.5 K/uL   RBC 4.35 3.87 - 5.11 MIL/uL   Hemoglobin 11.4 (L) 12.0 - 15.0 g/dL   HCT 16.1 (L) 09.6 - 04.5 %   MCV 81.8 78.0 - 100.0 fL   MCH 26.2 26.0 - 34.0 pg   MCHC 32.0 30.0 - 36.0 g/dL   RDW 40.9 (H) 81.1 - 91.4 %   Platelets 230 150 - 400 K/uL  Comprehensive metabolic panel     Status: Abnormal   Collection Time: 11/19/17  9:54 AM  Result Value Ref Range   Sodium 137 135 - 145 mmol/L   Potassium 4.3 3.5 - 5.1 mmol/L   Chloride 108 101 - 111 mmol/L   CO2 21 (L) 22 - 32 mmol/L   Glucose, Bld 87 65 - 99 mg/dL   BUN 8 6 - 20 mg/dL   Creatinine, Ser 7.82 0.44 - 1.00 mg/dL   Calcium 8.5 (L) 8.9 - 10.3 mg/dL   Total Protein 6.5 6.5 - 8.1 g/dL   Albumin 3.0 (L) 3.5 - 5.0 g/dL   AST 19 15 - 41 U/L   ALT 14 14 - 54 U/L   Alkaline Phosphatase 86 38 - 126 U/L   Total Bilirubin 0.5 0.3 - 1.2 mg/dL   GFR calc non Af Amer >60 >60 mL/min   GFR calc Af Amer >60 >60 mL/min   Anion gap 8 5 - 15    Assessment and Plan  Labor, prolonged latent phase  - Information provided on labor precautions reviewed -  Advised to keep appt on 4/22 with [facility] - Advised to return to MAU for labor,  VB, LOF or DFM - Discharge patient - Patient verbalized an understanding of the plan of care and agrees.  Raelyn Moraolitta Casie Sturgeon, MSN, CNM 11/19/2017, 12:29 PM

## 2017-11-19 NOTE — MAU Note (Signed)
Urine sent to lab 

## 2017-11-21 ENCOUNTER — Other Ambulatory Visit: Payer: Self-pay

## 2017-11-21 ENCOUNTER — Inpatient Hospital Stay (HOSPITAL_COMMUNITY): Payer: Managed Care, Other (non HMO) | Admitting: Anesthesiology

## 2017-11-21 ENCOUNTER — Inpatient Hospital Stay (HOSPITAL_COMMUNITY)
Admission: AD | Admit: 2017-11-21 | Discharge: 2017-11-23 | DRG: 807 | Disposition: A | Discharge status: Home / Self Care | Payer: Managed Care, Other (non HMO) | Source: Ambulatory Visit | Attending: Family Medicine | Admitting: Family Medicine

## 2017-11-21 ENCOUNTER — Encounter (HOSPITAL_COMMUNITY): Payer: Self-pay | Admitting: *Deleted

## 2017-11-21 DIAGNOSIS — O4292 Full-term premature rupture of membranes, unspecified as to length of time between rupture and onset of labor: Secondary | ICD-10-CM | POA: Diagnosis present

## 2017-11-21 DIAGNOSIS — O134 Gestational [pregnancy-induced] hypertension without significant proteinuria, complicating childbirth: Secondary | ICD-10-CM | POA: Diagnosis present

## 2017-11-21 DIAGNOSIS — Z9884 Bariatric surgery status: Secondary | ICD-10-CM

## 2017-11-21 DIAGNOSIS — Z3A38 38 weeks gestation of pregnancy: Secondary | ICD-10-CM

## 2017-11-21 DIAGNOSIS — Z3400 Encounter for supervision of normal first pregnancy, unspecified trimester: Secondary | ICD-10-CM

## 2017-11-21 DIAGNOSIS — O9989 Other specified diseases and conditions complicating pregnancy, childbirth and the puerperium: Secondary | ICD-10-CM

## 2017-11-21 DIAGNOSIS — Z3403 Encounter for supervision of normal first pregnancy, third trimester: Secondary | ICD-10-CM

## 2017-11-21 DIAGNOSIS — O99844 Bariatric surgery status complicating childbirth: Secondary | ICD-10-CM | POA: Diagnosis present

## 2017-11-21 DIAGNOSIS — Z2839 Other underimmunization status: Secondary | ICD-10-CM

## 2017-11-21 DIAGNOSIS — Z8759 Personal history of other complications of pregnancy, childbirth and the puerperium: Secondary | ICD-10-CM | POA: Diagnosis not present

## 2017-11-21 DIAGNOSIS — Z23 Encounter for immunization: Secondary | ICD-10-CM | POA: Diagnosis not present

## 2017-11-21 DIAGNOSIS — Z283 Underimmunization status: Secondary | ICD-10-CM

## 2017-11-21 LAB — CBC
HCT: 35 % — ABNORMAL LOW (ref 36.0–46.0)
Hemoglobin: 11.1 g/dL — ABNORMAL LOW (ref 12.0–15.0)
MCH: 25.9 pg — AB (ref 26.0–34.0)
MCHC: 31.7 g/dL (ref 30.0–36.0)
MCV: 81.8 fL (ref 78.0–100.0)
PLATELETS: 209 10*3/uL (ref 150–400)
RBC: 4.28 MIL/uL (ref 3.87–5.11)
RDW: 16 % — ABNORMAL HIGH (ref 11.5–15.5)
WBC: 7 10*3/uL (ref 4.0–10.5)

## 2017-11-21 LAB — TYPE AND SCREEN
ABO/RH(D): A POS
Antibody Screen: NEGATIVE

## 2017-11-21 LAB — POCT FERN TEST: POCT Fern Test: POSITIVE

## 2017-11-21 LAB — ABO/RH: ABO/RH(D): A POS

## 2017-11-21 MED ORDER — DIPHENHYDRAMINE HCL 50 MG/ML IJ SOLN: 12.5000 mg | INTRAMUSCULAR | Status: DC | PRN

## 2017-11-21 MED ORDER — ZOLPIDEM TARTRATE 5 MG PO TABS: 5.0000 mg | ORAL_TABLET | Freq: Every evening | ORAL | Status: DC | PRN

## 2017-11-21 MED ORDER — ONDANSETRON HCL 4 MG PO TABS: 4.0000 mg | ORAL_TABLET | ORAL | Status: DC | PRN

## 2017-11-21 MED ORDER — EPHEDRINE 5 MG/ML INJ
10.0000 mg | INTRAVENOUS | Status: DC | PRN
  Filled 2017-11-21: qty 2

## 2017-11-21 MED ORDER — ONDANSETRON HCL 4 MG/2ML IJ SOLN: 4.0000 mg | INTRAMUSCULAR | Status: DC | PRN

## 2017-11-21 MED ORDER — TERBUTALINE SULFATE 1 MG/ML IJ SOLN
0.2500 mg | Freq: Once | INTRAMUSCULAR | Status: DC | PRN
  Filled 2017-11-21: qty 1

## 2017-11-21 MED ORDER — OXYTOCIN BOLUS FROM INFUSION: 500.0000 mL | Freq: Once | INTRAVENOUS | Status: DC

## 2017-11-21 MED ORDER — BENZOCAINE-MENTHOL 20-0.5 % EX AERO
1.0000 "application " | INHALATION_SPRAY | CUTANEOUS | Status: DC | PRN
  Administered 2017-11-22: 1 via TOPICAL
  Filled 2017-11-21: qty 56

## 2017-11-21 MED ORDER — OXYCODONE-ACETAMINOPHEN 5-325 MG PO TABS: 1.0000 | ORAL_TABLET | ORAL | Status: DC | PRN

## 2017-11-21 MED ORDER — FENTANYL 2.5 MCG/ML BUPIVACAINE 1/10 % EPIDURAL INFUSION (WH - ANES)
14.0000 mL/h | INTRAMUSCULAR | Status: DC | PRN
  Administered 2017-11-21: 14 mL/h via EPIDURAL
  Filled 2017-11-21: qty 100

## 2017-11-21 MED ORDER — PRENATAL MULTIVITAMIN CH
1.0000 | ORAL_TABLET | Freq: Every day | ORAL | Status: DC
  Administered 2017-11-22 – 2017-11-23 (×2): 1 via ORAL
  Filled 2017-11-21 (×2): qty 1

## 2017-11-21 MED ORDER — SOD CITRATE-CITRIC ACID 500-334 MG/5ML PO SOLN: 30.0000 mL | ORAL | Status: DC | PRN

## 2017-11-21 MED ORDER — MEASLES, MUMPS & RUBELLA VAC ~~LOC~~ INJ
0.5000 mL | INJECTION | Freq: Once | SUBCUTANEOUS | Status: AC
  Administered 2017-11-23: 0.5 mL via SUBCUTANEOUS
  Filled 2017-11-21 (×2): qty 0.5

## 2017-11-21 MED ORDER — IBUPROFEN 600 MG PO TABS
600.0000 mg | ORAL_TABLET | Freq: Four times a day (QID) | ORAL | Status: DC
  Administered 2017-11-22 – 2017-11-23 (×7): 600 mg via ORAL
  Filled 2017-11-21 (×6): qty 1

## 2017-11-21 MED ORDER — LIDOCAINE-EPINEPHRINE (PF) 2 %-1:200000 IJ SOLN
INTRAMUSCULAR | Status: DC | PRN
  Administered 2017-11-21: 5 mL via EPIDURAL
  Administered 2017-11-21: 3 mL via EPIDURAL

## 2017-11-21 MED ORDER — PHENYLEPHRINE 40 MCG/ML (10ML) SYRINGE FOR IV PUSH (FOR BLOOD PRESSURE SUPPORT)
80.0000 ug | PREFILLED_SYRINGE | INTRAVENOUS | Status: DC | PRN
  Filled 2017-11-21: qty 5

## 2017-11-21 MED ORDER — ACETAMINOPHEN 325 MG PO TABS
650.0000 mg | ORAL_TABLET | ORAL | Status: DC | PRN
  Administered 2017-11-22 (×2): 650 mg via ORAL
  Filled 2017-11-21: qty 2

## 2017-11-21 MED ORDER — OXYCODONE-ACETAMINOPHEN 5-325 MG PO TABS
2.0000 | ORAL_TABLET | ORAL | Status: DC | PRN
Start: 2017-11-21 — End: 2017-11-21

## 2017-11-21 MED ORDER — OXYTOCIN 40 UNITS IN LACTATED RINGERS INFUSION - SIMPLE MED
2.5000 [IU]/h | INTRAVENOUS | Status: DC
  Administered 2017-11-21: 2.5 [IU]/h via INTRAVENOUS

## 2017-11-21 MED ORDER — PHENYLEPHRINE 40 MCG/ML (10ML) SYRINGE FOR IV PUSH (FOR BLOOD PRESSURE SUPPORT)
80.0000 ug | PREFILLED_SYRINGE | INTRAVENOUS | Status: DC | PRN
  Filled 2017-11-21: qty 5
  Filled 2017-11-21: qty 10

## 2017-11-21 MED ORDER — OXYTOCIN 40 UNITS IN LACTATED RINGERS INFUSION - SIMPLE MED
1.0000 m[IU]/min | INTRAVENOUS | Status: DC
  Administered 2017-11-21: 2 m[IU]/min via INTRAVENOUS
  Filled 2017-11-21: qty 1000

## 2017-11-21 MED ORDER — DIBUCAINE 1 % RE OINT: 1.0000 "application " | TOPICAL_OINTMENT | RECTAL | Status: DC | PRN

## 2017-11-21 MED ORDER — WITCH HAZEL-GLYCERIN EX PADS: 1.0000 "application " | MEDICATED_PAD | CUTANEOUS | Status: DC | PRN

## 2017-11-21 MED ORDER — SIMETHICONE 80 MG PO CHEW: 80.0000 mg | CHEWABLE_TABLET | ORAL | Status: DC | PRN

## 2017-11-21 MED ORDER — SODIUM CHLORIDE 0.9 % IV SOLN: 250.0000 mL | INTRAVENOUS | Status: DC | PRN

## 2017-11-21 MED ORDER — SODIUM CHLORIDE 0.9% FLUSH: 3.0000 mL | Freq: Two times a day (BID) | INTRAVENOUS | Status: DC

## 2017-11-21 MED ORDER — OXYCODONE HCL 5 MG PO TABS: 5.0000 mg | ORAL_TABLET | ORAL | Status: DC | PRN

## 2017-11-21 MED ORDER — LIDOCAINE HCL (PF) 1 % IJ SOLN
30.0000 mL | INTRAMUSCULAR | Status: DC | PRN
  Filled 2017-11-21: qty 30

## 2017-11-21 MED ORDER — FLEET ENEMA 7-19 GM/118ML RE ENEM: 1.0000 | ENEMA | RECTAL | Status: DC | PRN

## 2017-11-21 MED ORDER — LACTATED RINGERS IV SOLN
500.0000 mL | Freq: Once | INTRAVENOUS | Status: AC
  Administered 2017-11-21: 500 mL via INTRAVENOUS

## 2017-11-21 MED ORDER — SENNOSIDES-DOCUSATE SODIUM 8.6-50 MG PO TABS
2.0000 | ORAL_TABLET | ORAL | Status: DC
  Administered 2017-11-22 (×2): 2 via ORAL
  Filled 2017-11-21 (×2): qty 2

## 2017-11-21 MED ORDER — LACTATED RINGERS IV SOLN: INTRAVENOUS | Status: DC

## 2017-11-21 MED ORDER — LACTATED RINGERS IV SOLN: 500.0000 mL | INTRAVENOUS | Status: DC | PRN

## 2017-11-21 MED ORDER — COCONUT OIL OIL: 1.0000 "application " | TOPICAL_OIL | Status: DC | PRN

## 2017-11-21 MED ORDER — OXYCODONE HCL 5 MG PO TABS: 10.0000 mg | ORAL_TABLET | ORAL | Status: DC | PRN

## 2017-11-21 MED ORDER — SODIUM CHLORIDE 0.9% FLUSH
3.0000 mL | INTRAVENOUS | Status: DC | PRN
Start: 2017-11-21 — End: 2017-11-23

## 2017-11-21 MED ORDER — ONDANSETRON HCL 4 MG/2ML IJ SOLN: 4.0000 mg | Freq: Four times a day (QID) | INTRAMUSCULAR | Status: DC | PRN

## 2017-11-21 MED ORDER — DIPHENHYDRAMINE HCL 25 MG PO CAPS: 25.0000 mg | ORAL_CAPSULE | Freq: Four times a day (QID) | ORAL | Status: DC | PRN

## 2017-11-21 MED ORDER — ACETAMINOPHEN 325 MG PO TABS: 650.0000 mg | ORAL_TABLET | ORAL | Status: DC | PRN

## 2017-11-21 MED ORDER — TETANUS-DIPHTH-ACELL PERTUSSIS 5-2.5-18.5 LF-MCG/0.5 IM SUSP: 0.5000 mL | Freq: Once | INTRAMUSCULAR | Status: DC

## 2017-11-21 NOTE — Anesthesia Procedure Notes (Addendum)
Epidural Patient location during procedure: OB  Staffing Anesthesiologist: Odette FractionHarkins, Patrice Matthew, MD Performed: anesthesiologist   Preanesthetic Checklist Completed: patient identified, site marked, surgical consent, pre-op evaluation, timeout performed, IV checked, risks and benefits discussed and monitors and equipment checked  Epidural Patient position: sitting Prep: DuraPrep Patient monitoring: heart rate, continuous pulse ox and blood pressure Approach: midline Location: L3-L4 Injection technique: LOR saline and LOR air  Needle:  Needle type: Tuohy  Needle gauge: 17 G Needle length: 9 cm and 9 Needle insertion depth: 9 cm Catheter type: closed end flexible Catheter size: 20 Guage Catheter at skin depth: 18 cm Test dose: negative and 2% lidocaine with Epi 1:200 K  Assessment Sensory level: T8 Events: blood not aspirated, injection not painful, no injection resistance, negative IV test and no paresthesia  Additional Notes Patient identified. Risks/Benefits/Options discussed with patient including but not limited to bleeding, infection, nerve damage, paralysis, failed block, incomplete pain control, headache, blood pressure changes, nausea, vomiting, reactions to medication both or allergic, itching and postpartum back pain. Confirmed with bedside nurse the patient's most recent platelet count. Confirmed with patient that they are not currently taking any anticoagulation, have any bleeding history or any family history of bleeding disorders. Patient expressed understanding and wished to proceed. All questions were answered. Sterile technique was used throughout the entire procedure. Please see nursing notes for vital signs. Test dose was given through epidural catheter and negative prior to continuing to dose epidural or start infusion. Warning signs of high block given to the patient including shortness of breath, tingling/numbness in hands, complete motor block, or any concerning  symptoms with instructions to call for help. Patient was given instructions on fall risk and not to get out of bed. All questions and concerns addressed with instructions to call with any issues.

## 2017-11-21 NOTE — Anesthesia Pain Management Evaluation Note (Signed)
  CRNA Pain Management Visit Note  Patient: Rebecca [name], 32 y.o., female  "Hello [facility] am a [facility]. We have an anesthesia team available at all times to provide care throughout the hospital, including epidural management and anesthesia for C-section. I don't know your plan for the delivery whether it a natural birth, water birth, IV sedation, nitrous supplementation, doula or epidural, but we want to meet your pain goals."   1.Was your pain managed to your expectations on prior hospitalizations?   No prior hospitalizations  2.What is your expectation for pain management during this hospitalization?     Epidural  3.How can we help you reach that goal? unsure  Record the patient's initial score and the patient's pain goal.   Pain: 3  Pain Goal: 8 [facility] wants you to be able to say your pain was always managed very well.  Cephus ShellingBURGER,Santo Zahradnik 11/21/2017

## 2017-11-21 NOTE — Progress Notes (Signed)
Rebecca [name] is a 32 y.o. G1P0 at 6535w1d by ultrasound admitted for rupture of membranes  Subjective:   Objective: BP 126/80   Pulse 79   Temp 97.9 F (36.6 C) (Oral)   Resp 20   Ht 6\' 1"  (1.854 m)   Wt 273 lb 1.3 oz (123.9 kg)   LMP 02/14/2017 (Approximate)   BMI 36.03 kg/m  No intake/output data recorded. No intake/output data recorded.  FHT:  FHR: 120's bpm, variability: moderate,  accelerations:  Present,  decelerations:  Absent UC:   regular, every 2-3 minutes SVE:   Dilation: 4 Effacement (%): 80 Station: -2 Exam by:: [employer] IncFoley,rn  Labs: Lab Results  Component Value Date   WBC 7.0 11/21/2017   HGB 11.1 (L) 11/21/2017   HCT 35.0 (L) 11/21/2017   MCV 81.8 11/21/2017   PLT 209 11/21/2017    Assessment / Plan: Augmentation of labor, progressing well  Labor: Progressing normally Preeclampsia:  no signs or symptoms of toxicity Fetal Wellbeing:  Category I Pain Control:  Labor support without medications I/D:  n/a Anticipated MOD:  NSVD  Rebecca [name] 11/21/2017, 1:18 PM

## 2017-11-21 NOTE — MAU Note (Signed)
Pt reports to MAU c/o leaking that started at 2230 then at 0500 pt reports a big gush of clear fluid. Pt denies bleeding and last felt baby move last night. No bleeding.

## 2017-11-21 NOTE — H&P (Signed)
Rebecca [name] is a 32 y.o. female G1 @ 38.1 wks in with SROM @ 2200 last night.leaking mod amt cl fluid on admit. GBS neg OB History    Gravida  1   Para      Term      Preterm      AB      Living        SAB      TAB      Ectopic      Multiple      Live Births             Past Medical History:  Diagnosis Date  . Pregnant 03/29/2017  . S/P gastric bypass    Past Surgical History:  Procedure Laterality Date  . rny  03/09/2011  . ROUX-EN-Y PROCEDURE  03/09/11  . TONSILLECTOMY     Family History: family history includes Atrial fibrillation in her maternal grandfather; Hypertension in her maternal grandfather and maternal grandmother. Social History:  reports that she has never smoked. She has never used smokeless tobacco. She reports that she does not drink alcohol or use drugs.     Maternal Diabetes: No Genetic Screening: Normal Maternal Ultrasounds/Referrals: Normal Fetal Ultrasounds or other Referrals:  None Maternal Substance Abuse:  No Significant Maternal Medications:  None Significant Maternal Lab Results:  None Other Comments:  None  ROS Maternal Medical History:  Reason for admission: Rupture of membranes.   Contractions: Onset was 6-12 hours ago.   Frequency: rare.   Perceived severity is mild.    Fetal activity: Perceived fetal activity is normal.   Last perceived fetal movement was within the past hour.    Prenatal complications: no prenatal complications Prenatal Complications - Diabetes: none.    Dilation: 4 Effacement (%): 50 Station: -2 Exam by:: T Lytle RNC Blood pressure 128/81, pulse 67, temperature 98.2 F (36.8 C), temperature source Oral, resp. rate 20, height 6\' 1"  (1.854 m), weight 273 lb 1.3 oz (123.9 kg), last menstrual period 02/14/2017. Maternal Exam:  Uterine Assessment: Contraction strength is mild.  Contraction frequency is rare.   Abdomen: Patient reports no abdominal tenderness. Estimated fetal weight is 8lbs.    Fetal presentation: vertex  Introitus: Normal vulva. Normal vagina.  Ferning test: positive.  Nitrazine test: not done. Amniotic fluid character: clear.  Pelvis: adequate for delivery.   Cervix: Cervix evaluated by digital exam.     Fetal Exam Fetal Monitor Review: Mode: ultrasound.   Variability: moderate (6-25 bpm).   Pattern: accelerations present and no decelerations.    Fetal State Assessment: Category I - tracings are normal.     Physical Exam  Constitutional: She is oriented to person, place, and time. She appears well-developed and well-nourished.  HENT:  Head: Normocephalic.  Eyes: Pupils are equal, round, and reactive to light.  Neck: Normal range of motion.  Cardiovascular: Normal rate, regular rhythm, normal heart sounds and intact distal pulses.  Respiratory: Effort normal and breath sounds normal.  GI: Soft. Bowel sounds are normal.  Genitourinary: Vagina normal and uterus normal.  Musculoskeletal: Normal range of motion.  Neurological: She is alert and oriented to person, place, and time. She has normal reflexes.  Skin: Skin is dry.  Psychiatric: She has a normal mood and affect. Her behavior is normal. Judgment and thought content normal.    Prenatal labs: ABO, Rh: --/--/A POS (04/21 0745) Antibody: NEG (04/21 0745) Rubella: <0.90 (10/01 1145) RPR: Non Reactive (01/30 0908)  HBsAg: Negative (10/01 1145)  HIV: Non Reactive (01/30 0908)  GBS: Negative (04/15 0000)   Assessment/Plan: SROM @ 2200 last night No labor GBS neg SVE 4/80/-2 Pit augmentation of labor   Wyvonnia DuskyMarie Lawson 11/21/2017, 9:35 AM

## 2017-11-21 NOTE — Anesthesia Preprocedure Evaluation (Signed)
Anesthesia Evaluation  Patient identified by MRN, date of birth, ID band Patient awake    History of Anesthesia Complications Negative for: history of anesthetic complications  Airway Mallampati: II  TM Distance: >3 FB Neck ROM: Full    Dental no notable dental hx.    Pulmonary neg pulmonary ROS,    Pulmonary exam normal        Cardiovascular negative cardio ROS Normal cardiovascular exam Rhythm:Regular     Neuro/Psych negative neurological ROS  negative psych ROS   GI/Hepatic negative GI ROS, Neg liver ROS,   Endo/Other  negative endocrine ROS  Renal/GU negative Renal ROS  negative genitourinary   Musculoskeletal negative musculoskeletal ROS (+)   Abdominal Normal abdominal exam  (+)   Peds  Hematology negative hematology ROS (+)   Anesthesia Other Findings   Reproductive/Obstetrics (+) Pregnancy                             Anesthesia Physical Anesthesia Plan  ASA: II  Anesthesia Plan: Epidural   Post-op Pain Management:    Induction:   PONV Risk Score and Plan:   Airway Management Planned:   Additional Equipment:   Intra-op Plan:   Post-operative Plan:   Informed Consent: I have reviewed the patients History and Physical, chart, labs and discussed the procedure including the risks, benefits and alternatives for the proposed anesthesia with the patient or authorized representative who has indicated his/her understanding and acceptance.   Dental advisory given  Plan Discussed with:   Anesthesia Plan Comments:         Anesthesia Quick Evaluation

## 2017-11-21 NOTE — Anesthesia Pain Management Evaluation Note (Signed)
  CRNA Pain Management Visit Note  Patient: Rebecca [name], 32 y.o., female  "Hello I am a member of the anesthesia team at [facility]. We have an anesthesia team available at all times to provide care throughout the hospital, including epidural management and anesthesia for C-section. I don't know your plan for the delivery whether it a natural birth, water birth, IV sedation, nitrous supplementation, doula or epidural, but we want to meet your pain goals."   1.Was your pain managed to your expectations on prior hospitalizations?   No prior hospitalizations  2.What is your expectation for pain management during this hospitalization?     Epidural  3.How can we help you reach that goal?  Maintain epidural.  Record the patient's initial score and the patient's pain goal.   Pain: 3 - with epidural, described as pressure.  Pain Goal: 3 [facility] wants you to be able to say your pain was always managed very well.  Rebecca [name] 11/21/2017

## 2017-11-22 ENCOUNTER — Encounter: Payer: Managed Care, Other (non HMO) | Admitting: Obstetrics and Gynecology

## 2017-11-22 LAB — RPR: RPR Ser Ql: NONREACTIVE

## 2017-11-22 MED ORDER — HYDROCHLOROTHIAZIDE 25 MG PO TABS
25.0000 mg | ORAL_TABLET | Freq: Every day | ORAL | Status: DC
  Administered 2017-11-22 – 2017-11-23 (×2): 25 mg via ORAL
  Filled 2017-11-22 (×3): qty 1

## 2017-11-22 NOTE — Anesthesia Postprocedure Evaluation (Signed)
Anesthesia Post Note  Patient: Rebecca [name]  Procedure(s) Performed: AN AD HOC LABOR EPIDURAL     Patient location during evaluation: Mother Baby Anesthesia Type: Epidural Level of consciousness: awake and alert and oriented Pain management: satisfactory to patient Vital Signs Assessment: post-procedure vital signs reviewed and stable Respiratory status: respiratory function stable Cardiovascular status: stable Postop Assessment: no headache, no backache, epidural receding, patient able to bend at knees, no signs of nausea or vomiting and adequate PO intake Anesthetic complications: no    Last Vitals:  Vitals:   11/22/17 0643 11/22/17 0925  BP:  131/82  Pulse: 63 74  Resp:  16  Temp:  36.4 C  SpO2:  99%    Last Pain:  Vitals:   11/22/17 1211  TempSrc:   PainSc: 2    Pain Goal: Patients Stated Pain Goal: 2 (11/22/17 0925)               Karleen DolphinFUSSELL,Iysis Germain

## 2017-11-22 NOTE — Plan of Care (Signed)
  Problem: Activity: Goal: Ability to tolerate increased activity will improve Outcome: Completed/Met  Pt ambulating in the room.  Encouraged to gradually increase activity with the goal of ambulation in the hallways.  Pt denies any dizziness or difficulty with ambulation.  Instructed the pt to not get up if she feels dizzy.  Pt verbalized understanding.

## 2017-11-22 NOTE — Lactation Note (Addendum)
This note was copied from a baby's chart. Lactation Consultation Note  Patient Name: Boy Lanny HurstMegan Stierwalt [id number]'WToday's Date: 11/22/2017   Mother confirmed she only wants to formula feed and does not need lactation assistance.      Maternal Data    Feeding Feeding Type: Formula  LATCH Score                   Interventions    Lactation Tools Discussed/Used     Consult Status      Hardie PulleyBerkelhammer, Ruth Boschen 11/22/2017, 9:52 AM

## 2017-11-22 NOTE — Progress Notes (Signed)
Post Partum Day 1 Subjective: no complaints, up ad lib and voiding  Objective: Blood pressure 135/82, pulse 63, temperature 98.3 F (36.8 C), temperature source Oral, resp. rate 16, height 6\' 1"  (1.854 m), weight 273 lb 1.3 oz (123.9 kg), last menstrual period 02/14/2017, SpO2 99 %, unknown if currently breastfeeding.  Physical Exam:  General: alert, cooperative and appears stated age Lochia: appropriate Uterine Fundus: firm DVT Evaluation: No evidence of DVT seen on physical exam.  Recent Labs    11/19/17 0954 11/21/17 0745  HGB 11.4* 11.1*  HCT 35.6* 35.0*    Assessment/Plan: Plan for discharge tomorrow and Contraception undecided Add HCTZ to help with BP control, will ask about circumcision  LOS: 1 day   Reva Boresanya S Kendell Gammon 11/22/2017, 7:53 AM

## 2017-11-23 ENCOUNTER — Telehealth: Payer: Self-pay | Admitting: *Deleted

## 2017-11-23 DIAGNOSIS — Z8759 Personal history of other complications of pregnancy, childbirth and the puerperium: Secondary | ICD-10-CM | POA: Diagnosis not present

## 2017-11-23 LAB — BIRTH TISSUE RECOVERY COLLECTION (PLACENTA DONATION)

## 2017-11-23 MED ORDER — IBUPROFEN 600 MG PO TABS: 600.0000 mg | ORAL_TABLET | Freq: Four times a day (QID) | ORAL | 0 refills | Status: DC

## 2017-11-23 MED ORDER — HYDROCHLOROTHIAZIDE 25 MG PO TABS: 25.0000 mg | ORAL_TABLET | Freq: Every day | ORAL | 0 refills | Status: DC

## 2017-11-23 NOTE — Discharge Instructions (Signed)
Postpartum Care After Vaginal Delivery °The period of time right after you deliver your newborn is called the postpartum period. °What kind of medical care will I receive? °· You may continue to receive fluids and medicines through an IV tube inserted into one of your veins. °· If an incision was made near your vagina (episiotomy) or if you had some vaginal tearing during delivery, cold compresses may be placed on your episiotomy or your tear. This helps to reduce pain and swelling. °· You may be given a squirt bottle to use when you go to the bathroom. You may use this until you are comfortable wiping as usual. To use the squirt bottle, follow these steps: °? Before you urinate, fill the squirt bottle with warm water. Do not use hot water. °? After you urinate, while you are sitting on the toilet, use the squirt bottle to rinse the area around your urethra and vaginal opening. This rinses away any urine and blood. °? You may do this instead of wiping. As you start healing, you may use the squirt bottle before wiping yourself. Make sure to wipe gently. °? Fill the squirt bottle with clean water every time you use the bathroom. °· You will be given sanitary pads to wear. °How can I expect to feel? °· You may not feel the need to urinate for several hours after delivery. °· You will have some soreness and pain in your abdomen and vagina. °· If you are breastfeeding, you may have uterine contractions every time you breastfeed for up to several weeks postpartum. Uterine contractions help your uterus return to its normal size. °· It is normal to have vaginal bleeding (lochia) after delivery. The amount and appearance of lochia is often similar to a menstrual period in the first week after delivery. It will gradually decrease over the next few weeks to a dry, yellow-brown discharge. For most women, lochia stops completely by 6-8 weeks after delivery. Vaginal bleeding can vary from woman to woman. °· Within the first few  days after delivery, you may have breast engorgement. This is when your breasts feel heavy, full, and uncomfortable. Your breasts may also throb and feel hard, tightly stretched, warm, and tender. After this occurs, you may have milk leaking from your breasts. Your health care provider can help you relieve discomfort due to breast engorgement. Breast engorgement should go away within a few days. °· You may feel more sad or worried than normal due to hormonal changes after delivery. These feelings should not last more than a few days. If these feelings do not go away after several days, speak with your health care provider. °How should I care for myself? °· Tell your health care provider if you have pain or discomfort. °· Drink enough water to keep your urine clear or pale yellow. °· Wash your hands thoroughly with soap and water for at least 20 seconds after changing your sanitary pads, after using the toilet, and before holding or feeding your baby. °· If you are not breastfeeding, avoid touching your breasts a lot. Doing this can make your breasts produce more milk. °· If you become weak or lightheaded, or you feel like you might faint, ask for help before: °? Getting out of bed. °? Showering. °· Change your sanitary pads frequently. Watch for any changes in your flow, such as a sudden increase in volume, a change in color, the passing of large blood clots. If you pass a blood clot from your vagina, save it   to show to your health care provider. Do not flush blood clots down the toilet without having your health care provider look at them. °· Make sure that all your vaccinations are up to date. This can help protect you and your baby from getting certain diseases. You may need to have immunizations done before you leave the hospital. °· If desired, talk with your health care provider about methods of family planning or birth control (contraception). °How can I start bonding with my baby? °Spending as much time as  possible with your baby is very important. During this time, you and your baby can get to know each other and develop a bond. Having your baby stay with you in your room (rooming in) can give you time to get to know your baby. Rooming in can also help you become comfortable caring for your baby. Breastfeeding can also help you bond with your baby. °How can I plan for returning home with my baby? °· Make sure that you have a car seat installed in your vehicle. °? Your car seat should be checked by a certified car seat installer to make sure that it is installed safely. °? Make sure that your baby fits into the car seat safely. °· Ask your health care provider any questions you have about caring for yourself or your baby. Make sure that you are able to contact your health care provider with any questions after leaving the hospital. °This information is not intended to replace advice given to you by your health care provider. Make sure you discuss any questions you have with your health care provider. °Document Released: 05/17/2007 Document Revised: 12/23/2015 Document Reviewed: 06/24/2015 °Elsevier Interactive Patient Education © 2018 [employer] ° °

## 2017-11-23 NOTE — Discharge Summary (Addendum)
OB Discharge Summary     Patient Name: Rebecca [name] DOB: Mar 09, 1986 MRN: [id number]  Date of admission: 11/21/2017 Delivering MD: Daiva Nakayama   Date of discharge: 11/23/2017  Admitting diagnosis: 55 wks water broke Intrauterine pregnancy: [redacted]w[redacted]d    Secondary diagnosis:  Principal Problem:   Supervision of normal first pregnancy Active Problems:   S/P gastric bypass   Rubella non-immune status, antepartum   Indication for care or intervention in labor or delivery   Gestational hypertension without significant proteinuria, postpartum  Additional problems: Gestational hypertension- noted PPD#1     Discharge diagnosis: Term Pregnancy Delivered and Gestational Hypertension                                                                                                Post partum procedures:given MMR vax  Augmentation: Pitocin  Complications: None  Hospital course:  Onset of Labor With Vaginal Delivery     33y.o. yo G1P1001 at 33w1das admitted in Active Labor on 11/21/2017. Patient had an uncomplicated labor course as follows:  Membrane Rupture Time/Date: 10:30 PM ,11/20/2017   Intrapartum Procedures: Episiotomy: None [1]                                         Lacerations:  2nd degree [3]  Patient had a delivery of a Viable infant. 11/21/2017  Information for the patient's newborn:  Rebecca [name], Rebecca [name][[id number]]Delivery Method: Vaginal, Spontaneous(Filed from Delivery Summary)    Pateint had an uncomplicated postpartum course until PPD#1 when she was noted to have some borderline BPs. HCTZ daily was added to her meds. No s/s pre-e.  She is ambulating, tolerating a regular diet, passing flatus, and urinating well. Patient is discharged home in stable condition on 11/23/17.   Physical exam  Vitals:   11/22/17 0643 11/22/17 0925 11/22/17 1807 11/23/17 0523  BP:  131/82 (!) 141/80 133/88  Pulse: 63 74 79 63  Resp:  '16 18 18  ' Temp:  97.6 F (36.4 C) 97.6 F (36.4 C)  97.9 F (36.6 C)  TempSrc:  Oral    SpO2:  99% 98%   Weight:      Height:       General: alert, cooperative and no distress Lochia: appropriate Uterine Fundus: firm Incision: N/A DVT Evaluation: No evidence of DVT seen on physical exam. Labs: Lab Results  Component Value Date   WBC 7.0 11/21/2017   HGB 11.1 (L) 11/21/2017   HCT 35.0 (L) 11/21/2017   MCV 81.8 11/21/2017   PLT 209 11/21/2017   CMP Latest Ref Rng & Units 11/19/2017  Glucose 65 - 99 mg/dL 87  BUN 6 - 20 mg/dL 8  Creatinine 0.44 - 1.00 mg/dL 0.74  Sodium 135 - 145 mmol/L 137  Potassium 3.5 - 5.1 mmol/L 4.3  Chloride 101 - 111 mmol/L 108  CO2 22 - 32 mmol/L 21(L)  Calcium 8.9 - 10.3 mg/dL 8.5(L)  Total Protein 6.5 - 8.1 g/dL 6.5  Total  Bilirubin 0.3 - 1.2 mg/dL 0.5  Alkaline Phos 38 - 126 U/L 86  AST 15 - 41 U/L 19  ALT 14 - 54 U/L 14    Discharge instruction: per After Visit Summary and "Baby and Me Booklet".  After visit meds:  Allergies as of 11/23/2017   No Known Allergies     Medication List    STOP taking these medications   ferrous sulfate 325 (65 FE) MG tablet     TAKE these medications   CALCIUM PO Take 1 tablet by mouth daily.   hydrochlorothiazide 25 MG tablet Commonly known as:  HYDRODIURIL Take 1 tablet (25 mg total) by mouth daily.   ibuprofen 600 MG tablet Commonly known as:  ADVIL,MOTRIN Take 1 tablet (600 mg total) by mouth every 6 (six) hours.   OB COMPLETE PETITE 35-5-1-200 MG Caps Take 1 daily What changed:    how much to take  how to take this  when to take this  additional instructions   VITAMIN B-12 PO Take 1 capsule by mouth daily.       Diet: routine diet  Activity: Advance as tolerated. Pelvic rest for 6 weeks.   Outpatient follow up:1 wk for BP check, then 4 weeks for PP visit Follow up Appt:No future appointments. Follow up Visit:No follow-ups on file.  Postpartum contraception: Undecided but thinking OCPs  Newborn Data: Live born female   Birth Weight: 7 lb 7.6 oz (3391 g) APGAR: 8, 9  Newborn Delivery   Birth date/time:  11/21/2017 17:16:00 Delivery type:  Vaginal, Spontaneous     Baby Feeding: Bottle Disposition:home with mother   Patient scheduled for BP check 1 week postpartum. Started on HCTZ during this hospitalization secondary to persistently elevated blood pressures.    11/23/2017 Rebecca Sickles, MD  CNM attestation I have seen and examined this patient and agree with above documentation in the resident's note.   Rebecca [name] is a 32 y.o. G1P1001 s/p SVD.   Pain is well controlled.  Plan for birth control is oral contraceptives (estrogen/progesterone).  Method of Feeding: bottle  PE:  BP 135/78 (BP Location: Left Arm)   Pulse 63   Temp 97.9 F (36.6 C)   Resp 18   Ht '6\' 1"'  (1.854 m)   Wt 123.9 kg (273 lb 1.3 oz)   LMP 02/14/2017 (Approximate)   SpO2 98%   Breastfeeding? Unknown   BMI 36.03 kg/m  Fundus firm  Recent Labs    11/21/17 0745  HGB 11.1*  HCT 35.0*     Plan: discharge today - postpartum care discussed - f/u clinic in 1wk for BP check, then 4 weeks for postpartum visit   Serita Grammes, CNM 1:26 PM 11/23/2017

## 2017-11-23 NOTE — Telephone Encounter (Signed)
LMOM for pt to call us back to sch pp appointment.  11-23-16  AS

## 2017-12-02 ENCOUNTER — Ambulatory Visit (INDEPENDENT_AMBULATORY_CARE_PROVIDER_SITE_OTHER): Payer: Managed Care, Other (non HMO) | Admitting: Obstetrics and Gynecology

## 2017-12-02 ENCOUNTER — Encounter: Payer: Self-pay | Admitting: *Deleted

## 2017-12-02 VITALS — BP 148/92 | HR 83 | Ht 72.0 in | Wt 249.2 lb

## 2017-12-02 DIAGNOSIS — O165 Unspecified maternal hypertension, complicating the puerperium: Secondary | ICD-10-CM | POA: Diagnosis not present

## 2017-12-02 MED ORDER — HYDROCHLOROTHIAZIDE 25 MG PO TABS: 25.0000 mg | ORAL_TABLET | Freq: Every day | ORAL | 0 refills | Status: DC

## 2017-12-02 NOTE — Progress Notes (Signed)
Patient ID: Rebecca [name], female   DOB: 1986/07/21, 32 y.o.   MRN: [id number]   Subjective:  Rebecca [name] is a 32 y.o. female who presents for a 2 weeks postpartum visit  Patient concerns: Her blood pressure has continued to stay elevated. The patient has mild edema in her bilateral feet. She is not breast feeding.   Prenatal and intrapartum course notable for vaginal delivery   Patient is not sexually active.   The following portions of the patient's history were reviewed and updated as appropriate: allergies, current medications, past family history, past medical history, past surgical history and problem list.  Review of Systems    See Subjective, otherwise negative ROS.  Objective:  BP (!) 148/92 (BP Location: Right Arm, Patient Position: Sitting, Cuff Size: Large)   Pulse 83   Ht 6' (1.829 m)   Wt 249 lb 3.2 oz (113 kg)   Breastfeeding? No   BMI 33.80 kg/m   General:  alert, cooperative and no distress     Lungs: clear to auscultation bilaterally  Heart:  regular rate and rhythm, S1, S2 normal, no murmur  Abdomen: soft, non-tender; bowel sounds normal; no masses,  no organomegaly   Vulva:  normal  Vagina: normal vagina  Cervix:  normal  Corpus: normal size, contour, position, consistency, mobility, non-tender  Adnexa:  normal adnexa          Assessment:  1. postpartum exam.  2. Contraception: did not discuss at this visit  Plan:  1. Rx HTZ 2. F/u 4 weeks postpartum visit  By signing my name below, I, Diona Browner, attest that this documentation has been prepared under the direction and in the presence of Eure, Amaryllis Dyke, MD. Electronically Signed: Diona Browner, Medical Scribe. 12/02/17. 12:19 PM.  I personally performed the services described in this documentation, which was SCRIBED in my presence. The recorded information has been reviewed and considered accurate. It has been edited as necessary during review. Tilda Burrow, MD

## 2017-12-29 ENCOUNTER — Ambulatory Visit (INDEPENDENT_AMBULATORY_CARE_PROVIDER_SITE_OTHER): Payer: Managed Care, Other (non HMO) | Admitting: Women's Health

## 2017-12-29 ENCOUNTER — Encounter: Payer: Self-pay | Admitting: Women's Health

## 2017-12-29 DIAGNOSIS — Z8759 Personal history of other complications of pregnancy, childbirth and the puerperium: Secondary | ICD-10-CM

## 2017-12-29 DIAGNOSIS — Z8679 Personal history of other diseases of the circulatory system: Secondary | ICD-10-CM | POA: Insufficient documentation

## 2017-12-29 NOTE — Progress Notes (Signed)
   POSTPARTUM VISIT Patient name: Rebecca [name] MRN [id number]  Date of birth: 04/10/1986 Chief Complaint:   postpartum visit  History of Present Illness:   Rebecca [name] is a 32 y.o. G36P1001 Caucasian female being seen today for a postpartum visit. She is 5 weeks postpartum following a spontaneous vaginal delivery at 38.1 gestational weeks. Anesthesia: epidural. I have fully reviewed the prenatal and intrapartum course. Pregnancy complicated by [facility] requiring d/c on HCTZ. Postpartum course has been complicated by persistent PPHTN at 1wk bp check. Stopped HCTZ about 2wks ago.  Bleeding no bleeding. Bowel function is normal. Bladder function is normal.  Patient is not sexually active. Last sexual activity: prior to birth of baby.  Contraception method is condoms for now, doesn't want anything hormonal right now d/t h/o irregular periods/taking awhile to get pregnan, will let us know if she changes her mind.  [place] Postpartum Depression Screening: negative. Score 4.   Last pap 05/03/17.  Results were neg w/ -HRHPV .  No LMP recorded.  Baby's course has been uncomplicated. Baby is feeding by bottle.  Review of Systems:   Pertinent items are noted in HPI Denies Abnormal vaginal discharge w/ itching/odor/irritation, headaches, visual changes, shortness of breath, chest pain, abdominal pain, severe nausea/vomiting, or problems with urination or bowel movements. Pertinent History Reviewed:  Reviewed past medical,surgical, obstetrical and family history.  Reviewed problem list, medications and allergies. OB History  Gravida Para Term Preterm AB Living  SAB TAB Ectopic Multiple Live Births        0 1    # Outcome Date GA Lbr Len/2nd Weight Sex Delivery Anes PTL Lv  1 Term 11/21/17 [redacted]w[redacted]d 10:30 / 01:46 7 lb 7.6 oz (3.391 kg) M Vag-Spont EPI  LIV   Physical Assessment:   Vitals:   12/29/17 1212  BP: 130/86  Pulse: 85  Weight: 255 lb (115.7 kg)  Height:  (1.854 m)  Body  mass index is 33.64 kg/m.       Physical Examination:   General appearance: alert, well appearing, and in no distress  Mental status: alert, oriented to person, place, and time  Skin: warm & dry   Cardiovascular: normal heart rate noted   Respiratory: normal respiratory effort, no distress   Breasts: deferred, no complaints   Abdomen: soft, non-tender   Pelvic: VULVA: normal appearing vulva with no masses, tenderness or lesions, UTERUS: uterus is normal size, shape, consistency and nontender  Rectal: no hemorrhoids  Extremities: no edema       No results found for this or any previous visit (from the past 24 hour(s)).  Assessment & Plan:  1) Postpartum exam 2) 5 wks s/p SVB 3) Bottlefeeding 4) Depression screening 5) Contraception counseling, pt prefers condoms  6) Resolved PPHTN> off HCTZ x 2wks now, bp high-normal, offered appt in 2wks to recheck vs. checking at home- pt prefers to check at home- if consistently >140 or >90 to let us know  Meds: No orders of the defined types were placed in this encounter.   Follow-up: Return for Oct for physical.   No orders of the defined types were placed in this encounter.   Cheral Marker CNM, [facility] 12/29/2017 1:13 PM

## 2017-12-29 NOTE — Patient Instructions (Signed)
Check blood pressure at home, if consistently >140 on top or >90 on bottom please let us know

## 2019-02-07 ENCOUNTER — Other Ambulatory Visit: Payer: Self-pay

## 2019-02-07 ENCOUNTER — Ambulatory Visit (INDEPENDENT_AMBULATORY_CARE_PROVIDER_SITE_OTHER): Payer: 59 | Admitting: Physician Assistant

## 2019-02-07 ENCOUNTER — Ambulatory Visit: Payer: Managed Care, Other (non HMO) | Admitting: Physician Assistant

## 2019-02-07 ENCOUNTER — Encounter: Payer: Self-pay | Admitting: Physician Assistant

## 2019-02-07 VITALS — BP 121/81 | HR 75 | Temp 99.1°F | Resp 16 | Ht 72.0 in | Wt 237.1 lb

## 2019-02-07 DIAGNOSIS — Z9884 Bariatric surgery status: Secondary | ICD-10-CM | POA: Diagnosis not present

## 2019-02-07 DIAGNOSIS — E669 Obesity, unspecified: Secondary | ICD-10-CM

## 2019-02-07 DIAGNOSIS — Z0000 Encounter for general adult medical examination without abnormal findings: Secondary | ICD-10-CM

## 2019-02-07 LAB — COMPREHENSIVE METABOLIC PANEL
ALT: 10 U/L (ref 0–35)
AST: 14 U/L (ref 0–37)
Albumin: 4.2 g/dL (ref 3.5–5.2)
Alkaline Phosphatase: 61 U/L (ref 39–117)
BUN: 14 mg/dL (ref 6–23)
CO2: 27 mEq/L (ref 19–32)
Calcium: 8.5 mg/dL (ref 8.4–10.5)
Chloride: 107 mEq/L (ref 96–112)
Creatinine, Ser: 0.64 mg/dL (ref 0.40–1.20)
GFR: 106.65 mL/min (ref >60.00)
Glucose, Bld: 71 mg/dL (ref 70–99)
Potassium: 4.6 mEq/L (ref 3.5–5.1)
Sodium: 140 mEq/L (ref 135–145)
Total Bilirubin: 0.4 mg/dL (ref 0.2–1.2)
Total Protein: 6.4 g/dL (ref 6.0–8.3)

## 2019-02-07 LAB — LIPID PANEL
Cholesterol: 169 mg/dL (ref 0–200)
HDL: 85.6 mg/dL (ref >39.00)
LDL Cholesterol: 73 mg/dL (ref 0–99)
NonHDL: 83.43
Total CHOL/HDL Ratio: 2
Triglycerides: 50 mg/dL (ref 0.0–149.0)
VLDL: 10 mg/dL (ref 0.0–40.0)

## 2019-02-07 LAB — CBC WITH DIFFERENTIAL/PLATELET
Basophils Absolute: 0.1 10*3/uL (ref 0.0–0.1)
Basophils Relative: 1 % (ref 0.0–3.0)
Eosinophils Absolute: 0.1 10*3/uL (ref 0.0–0.7)
Eosinophils Relative: 1.2 % (ref 0.0–5.0)
HCT: 29.4 % — ABNORMAL LOW (ref 36.0–46.0)
Hemoglobin: 9.2 g/dL — ABNORMAL LOW (ref 12.0–15.0)
Lymphocytes Relative: 23.3 % (ref 12.0–46.0)
Lymphs Abs: 1.5 10*3/uL (ref 0.7–4.0)
MCHC: 31.3 g/dL (ref 30.0–36.0)
MCV: 71 fl — ABNORMAL LOW (ref 78.0–100.0)
Monocytes Absolute: 0.5 10*3/uL (ref 0.1–1.0)
Monocytes Relative: 8.1 % (ref 3.0–12.0)
Neutro Abs: 4.1 10*3/uL (ref 1.4–7.7)
Neutrophils Relative %: 66.4 % (ref 43.0–77.0)
Platelets: 277 10*3/uL (ref 150.0–400.0)
RBC: 4.15 Mil/uL (ref 3.87–5.11)
RDW: 19.3 % — ABNORMAL HIGH (ref 11.5–15.5)
WBC: 6.2 10*3/uL (ref 4.0–10.5)

## 2019-02-07 LAB — B12 AND FOLATE PANEL
Folate: 7.3 ng/mL (ref >5.9)
Vitamin B-12: 153 pg/mL — ABNORMAL LOW (ref 211–911)

## 2019-02-07 LAB — HEMOGLOBIN A1C: Hgb A1c MFr Bld: 5.9 % (ref 4.6–6.5)

## 2019-02-07 LAB — TSH: TSH: 2.02 u[IU]/mL (ref 0.35–4.50)

## 2019-02-07 LAB — IRON: Iron: 20 ug/dL — ABNORMAL LOW (ref 42–145)

## 2019-02-07 LAB — VITAMIN D 25 HYDROXY (VIT D DEFICIENCY, FRACTURES): VITD: 16.67 ng/mL — ABNORMAL LOW (ref 30.00–100.00)

## 2019-02-07 NOTE — Progress Notes (Signed)
Patient presents to clinic today for annual exam.  Patient is fasting for labs.  Diet -- Well-balanced diet overall. Eats mostly plant-based protein. Keeps hydrated. Exercise -- No regular regimen but does get in 10,000 steps daily at work.  Acute Concerns: Denies acute concerns at today's visit.   Chronic Issues: Morbid Obesity s/p Gastric Bypass in 2012 -- No current supplementation. Unsure of last check on vitamin levels. Will check today. Denies fatigue, SOB, pallor.   Health Maintenance: Immunizations -- UTD PAP -- UTD  Past Medical History:  Diagnosis Date  . Pregnant 03/29/2017  . S/P gastric bypass     Past Surgical History:  Procedure Laterality Date  . rny  03/09/2011  . ROUX-EN-Y PROCEDURE  03/09/11  . TONSILLECTOMY      Current Outpatient Medications on File Prior to Visit  Medication Sig Dispense Refill  . CALCIUM PO Take 1 tablet by mouth daily.     . Cyanocobalamin (VITAMIN B-12 PO) Take 1 capsule by mouth daily.     . IRON PO Take 65 mg by mouth daily.     No current facility-administered medications on file prior to visit.     No Known Allergies  Family History  Problem Relation Age of Onset  . Anxiety disorder Sister   . Hypertension Maternal Grandmother   . Hypertension Maternal Grandfather   . Atrial fibrillation Maternal Grandfather   . Breast cancer Maternal Aunt 80       1 Maternal Aunt - Amy    Social History   Socioeconomic History  . Marital status: Married    Spouse name: Not on file  . Number of children: Not on file  . Years of education: Not on file  . Highest education level: Not on file  Occupational History  . Occupation: Careers adviser with [employer]  Social Needs  . Financial resource strain: Not on file  . Food insecurity    Worry: Not on file    Inability: Not on file  . Transportation needs    Medical: Not on file    Non-medical: Not on file  Tobacco Use  . Smoking status: Never Smoker  . Smokeless tobacco: Never  Used  Substance and Sexual Activity  . Alcohol use: No    Alcohol/week: 4.0 standard drinks    Types: 4 Glasses of wine per week    Frequency: Never    Comment: occ before pregnancy  . Drug use: No  . Sexual activity: Not Currently    Partners: Male    Birth control/protection: None  Lifestyle  . Physical activity    Days per week: Not on file    Minutes per session: Not on file  . Stress: Not on file  Relationships  . Social Herbalist on phone: Not on file    Gets together: Not on file    Attends religious service: Not on file    Active member of club or organization: Not on file    Attends meetings of clubs or organizations: Not on file    Relationship status: Not on file  . Intimate partner violence    Fear of current or ex partner: Not on file    Emotionally abused: Not on file    Physically abused: Not on file    Forced sexual activity: Not on file  Other Topics Concern  . Not on file  Social History Narrative  . Not on file   Review of Systems  Constitutional:  Negative for fever and weight loss.  HENT: Negative for ear discharge, ear pain, hearing loss and tinnitus.   Eyes: Negative for blurred vision, double vision, photophobia and pain.  Respiratory: Negative for cough and shortness of breath.   Cardiovascular: Negative for chest pain and palpitations.  Gastrointestinal: Negative for abdominal pain, blood in stool, constipation, diarrhea, heartburn, melena, nausea and vomiting.  Genitourinary: Negative for dysuria, flank pain, frequency, hematuria and urgency.  Musculoskeletal: Negative for falls.  Neurological: Negative for dizziness, loss of consciousness and headaches.  Endo/Heme/Allergies: Negative for environmental allergies.  Psychiatric/Behavioral: Negative for depression, hallucinations, substance abuse and suicidal ideas. The patient is not nervous/anxious and does not have insomnia.    BP 121/81   Pulse 75   Temp 99.1 F (37.3 C) (Skin)    Resp 16   Ht 6' (1.829 m)   Wt 237 lb 2 oz (107.6 kg)   LMP 01/04/2019 (Approximate)   SpO2 97%   Breastfeeding No   BMI 32.16 kg/m   Physical Exam Vitals signs reviewed.  HENT:     Head: Normocephalic and atraumatic.     Right Ear: Tympanic membrane, ear canal and external ear normal.     Left Ear: Tympanic membrane, ear canal and external ear normal.     Nose: Nose normal. No mucosal edema.     Mouth/Throat:     Pharynx: Uvula midline. No oropharyngeal exudate or posterior oropharyngeal erythema.  Eyes:     Conjunctiva/sclera: Conjunctivae normal.     Pupils: Pupils are equal, round, and reactive to light.  Neck:     Musculoskeletal: Neck supple.     Thyroid: No thyromegaly.  Cardiovascular:     Rate and Rhythm: Normal rate and regular rhythm.     Heart sounds: Normal heart sounds.  Pulmonary:     Effort: Pulmonary effort is normal. No respiratory distress.     Breath sounds: Normal breath sounds. No wheezing or rales.  Abdominal:     General: Bowel sounds are normal. There is no distension.     Palpations: Abdomen is soft. There is no mass.     Tenderness: There is no abdominal tenderness. There is no guarding or rebound.  Lymphadenopathy:     Cervical: No cervical adenopathy.  Skin:    General: Skin is warm and dry.     Findings: No rash.  Neurological:     Mental Status: She is alert and oriented to person, place, and time.     Cranial Nerves: No cranial nerve deficit.    Assessment/Plan: 1. Visit for preventive health examination Depression screen negative. Health Maintenance reviewed. Preventive schedule discussed and handout given in AVS. Will obtain fasting labs today.  - Comprehensive metabolic panel - CBC with Differential/Platelet - Hemoglobin A1c - Lipid panel - TSH  2. S/P gastric bypass - B12 and Folate Panel - Iron - Vitamin D (25 hydroxy)  3. Obesity (BMI 30.0-34.9) Dietary and exercise recommendations reviewed. Will check Lipids and  A1C today.   Piedad ClimesWilliam Cody Achsah Mcquade, PA-C

## 2019-02-07 NOTE — Patient Instructions (Signed)
Please go to the lab for blood work.   Our office will call you with your results unless you have chosen to receive results via MyChart.  If your blood work is normal we will follow-up each year for physicals and as scheduled for chronic medical problems.  If anything is abnormal we will treat accordingly and get you in for a follow-up.   Preventive Care 84-33 Years Old, Female Preventive care refers to visits with your health care provider and lifestyle choices that can promote health and wellness. This includes:  A yearly physical exam. This may also be called an annual well check.  Regular dental visits and eye exams.  Immunizations.  Screening for certain conditions.  Healthy lifestyle choices, such as eating a healthy diet, getting regular exercise, not using drugs or products that contain nicotine and tobacco, and limiting alcohol use. What can I expect for my preventive care visit? Physical exam Your health care provider will check your:  Height and weight. This may be used to calculate body mass index (BMI), which tells if you are at a healthy weight.  Heart rate and blood pressure.  Skin for abnormal spots. Counseling Your health care provider may ask you questions about your:  Alcohol, tobacco, and drug use.  Emotional well-being.  Home and relationship well-being.  Sexual activity.  Eating habits.  Work and work Statistician.  Method of birth control.  Menstrual cycle.  Pregnancy history. What immunizations do I need?  Influenza (flu) vaccine  This is recommended every year. Tetanus, diphtheria, and pertussis (Tdap) vaccine  You may need a Td booster every 10 years. Varicella (chickenpox) vaccine  You may need this if you have not been vaccinated. Human papillomavirus (HPV) vaccine  If recommended by your health care provider, you may need three doses over 6 months. Measles, mumps, and rubella (MMR) vaccine  You may need at least one dose of  MMR. You may also need a second dose. Meningococcal conjugate (MenACWY) vaccine  One dose is recommended if you are age 55-21 years and a first-year college student living in a residence hall, or if you have one of several medical conditions. You may also need additional booster doses. Pneumococcal conjugate (PCV13) vaccine  You may need this if you have certain conditions and were not previously vaccinated. Pneumococcal polysaccharide (PPSV23) vaccine  You may need one or two doses if you smoke cigarettes or if you have certain conditions. Hepatitis A vaccine  You may need this if you have certain conditions or if you travel or work in places where you may be exposed to hepatitis A. Hepatitis B vaccine  You may need this if you have certain conditions or if you travel or work in places where you may be exposed to hepatitis B. Haemophilus influenzae type b (Hib) vaccine  You may need this if you have certain conditions. You may receive vaccines as individual doses or as more than one vaccine together in one shot (combination vaccines). Talk with your health care provider about the risks and benefits of combination vaccines. What tests do I need?  Blood tests  Lipid and cholesterol levels. These may be checked every 5 years starting at age 32.  Hepatitis C test.  Hepatitis B test. Screening  Diabetes screening. This is done by checking your blood sugar (glucose) after you have not eaten for a while (fasting).  Sexually transmitted disease (STD) testing.  BRCA-related cancer screening. This may be done if you have a family history of  breast, ovarian, tubal, or peritoneal cancers.  Pelvic exam and Pap test. This may be done every 3 years starting at age 33. Starting at age 40, this may be done every 5 years if you have a Pap test in combination with an HPV test. Talk with your health care provider about your test results, treatment options, and if necessary, the need for more  tests. Follow these instructions at home: Eating and drinking   Eat a diet that includes fresh fruits and vegetables, whole grains, lean protein, and low-fat dairy.  Take vitamin and mineral supplements as recommended by your health care provider.  Do not drink alcohol if: ? Your health care provider tells you not to drink. ? You are pregnant, may be pregnant, or are planning to become pregnant.  If you drink alcohol: ? Limit how much you have to 0-1 drink a day. ? Be aware of how much alcohol is in your drink. In the [place] one drink equals one 12 oz bottle of beer (355 mL), one 5 oz glass of wine (148 mL), or one 1 oz glass of hard liquor (44 mL). Lifestyle  Take daily care of your teeth and gums.  Stay active. Exercise for at least 30 minutes on 5 or more days each week.  Do not use any products that contain nicotine or tobacco, such as cigarettes, e-cigarettes, and chewing tobacco. If you need help quitting, ask your health care provider.  If you are sexually active, practice safe sex. Use a condom or other form of birth control (contraception) in order to prevent pregnancy and STIs (sexually transmitted infections). If you plan to become pregnant, see your health care provider for a preconception visit. What's next?  Visit your health care provider once a year for a well check visit.  Ask your health care provider how often you should have your eyes and teeth checked.  Stay up to date on all vaccines. This information is not intended to replace advice given to you by your health care provider. Make sure you discuss any questions you have with your health care provider. Document Released: 09/15/2001 Document Revised: 03/31/2018 Document Reviewed: 03/31/2018 Elsevier Patient Education  2020 [employer]. .

## 2019-02-08 ENCOUNTER — Encounter: Payer: Self-pay | Admitting: Physician Assistant

## 2019-02-08 ENCOUNTER — Other Ambulatory Visit: Payer: Self-pay | Admitting: Physician Assistant

## 2019-02-08 DIAGNOSIS — E559 Vitamin D deficiency, unspecified: Secondary | ICD-10-CM

## 2019-02-08 DIAGNOSIS — E538 Deficiency of other specified B group vitamins: Secondary | ICD-10-CM

## 2019-02-08 DIAGNOSIS — E611 Iron deficiency: Secondary | ICD-10-CM

## 2019-02-08 DIAGNOSIS — K912 Postsurgical malabsorption, not elsewhere classified: Secondary | ICD-10-CM

## 2019-02-08 MED ORDER — VITAMIN D (ERGOCALCIFEROL) 1.25 MG (50000 UNIT) PO CAPS: 50000.0000 [IU] | ORAL_CAPSULE | ORAL | 0 refills | Status: DC

## 2019-02-08 MED FILL — VIT D2 1.25 MG (50,000 UNIT: 1.25 MG | 84 days supply | Qty: 12 | Fill #0

## 2019-02-17 MED FILL — VIT D2 1.25 MG (50,000 UNIT: 1.25 MG | 84 days supply | Qty: 12 | Fill #0

## 2020-01-04 ENCOUNTER — Encounter: Payer: Self-pay | Admitting: Adult Health

## 2020-01-04 ENCOUNTER — Ambulatory Visit (INDEPENDENT_AMBULATORY_CARE_PROVIDER_SITE_OTHER): Payer: 59 | Admitting: Adult Health

## 2020-01-04 ENCOUNTER — Other Ambulatory Visit: Payer: Self-pay

## 2020-01-04 VITALS — BP 134/86 | HR 79 | Ht 72.0 in | Wt 252.8 lb

## 2020-01-04 DIAGNOSIS — O3680X0 Pregnancy with inconclusive fetal viability, not applicable or unspecified: Secondary | ICD-10-CM

## 2020-01-04 DIAGNOSIS — N926 Irregular menstruation, unspecified: Secondary | ICD-10-CM

## 2020-01-04 DIAGNOSIS — Z3201 Encounter for pregnancy test, result positive: Secondary | ICD-10-CM

## 2020-01-04 DIAGNOSIS — Z3A01 Less than 8 weeks gestation of pregnancy: Secondary | ICD-10-CM | POA: Diagnosis not present

## 2020-01-04 LAB — POCT URINE PREGNANCY: Preg Test, Ur: POSITIVE — AB

## 2020-01-04 NOTE — Progress Notes (Signed)
  Subjective:     Patient ID: Rebecca [name], female   DOB: 30-Mar-1986, 34 y.o.   MRN: [id number]  HPI Rebecca [name] is a 34 year old white female,married, in for UPT, has missed a period and had 1+HPT. She works at [employer]. PCP is Marcelline Mates PA.  Review of Systems +missed period,had 1+HPT Reviewed past medical,surgical, social and family history. Reviewed medications and allergies.     Objective:   Physical Exam BP 134/86 (BP Location: Left Arm, Patient Position: Sitting, Cuff Size: Normal)   Pulse 79   Ht 6' (1.829 m)   Wt 252 lb 12.8 oz (114.7 kg)   LMP 12/05/2019   BMI 34.29 kg/m UPT is +, about 4+2 weeks by LMP with EDD 09/10/20.Skin warm and dry. Neck: mid line trachea, normal thyroid, good ROM, no lymphadenopathy noted. Lungs: clear to ausculation bilaterally. Cardiovascular: regular rate and rhythm. Abdomen is soft and non tender PHQ 2 score is 0.    Assessment:     1. Missed period  2. Less than [redacted] weeks gestation of pregnancy  3. Positive pregnancy test Take OTC PNV  4. Encounter to determine fetal viability of pregnancy, single or unspecified fetus Return in 3 weeks for dating [place]    Plan:     Review handout by [facility]

## 2020-01-25 ENCOUNTER — Other Ambulatory Visit: Payer: Self-pay

## 2020-01-25 ENCOUNTER — Ambulatory Visit (INDEPENDENT_AMBULATORY_CARE_PROVIDER_SITE_OTHER): Payer: 59

## 2020-01-25 DIAGNOSIS — Z3A01 Less than 8 weeks gestation of pregnancy: Secondary | ICD-10-CM | POA: Diagnosis not present

## 2020-01-25 DIAGNOSIS — O3680X0 Pregnancy with inconclusive fetal viability, not applicable or unspecified: Secondary | ICD-10-CM

## 2020-01-25 NOTE — Progress Notes (Signed)
[place] 7+2 wks,single IUP with YS,positive FHT 129 bpm,crl 9.08 mm,normal ovaries

## 2020-02-10 ENCOUNTER — Telehealth: Payer: 59 | Admitting: Nurse Practitioner

## 2020-02-10 DIAGNOSIS — J0100 Acute maxillary sinusitis, unspecified: Secondary | ICD-10-CM | POA: Diagnosis not present

## 2020-02-10 MED ORDER — AMOXICILLIN-POT CLAVULANATE 875-125 MG PO TABS: 1.0000 | ORAL_TABLET | Freq: Two times a day (BID) | ORAL | 0 refills | Status: DC

## 2020-02-10 NOTE — Progress Notes (Signed)

## 2020-03-06 ENCOUNTER — Other Ambulatory Visit: Payer: Self-pay | Admitting: Obstetrics & Gynecology

## 2020-03-06 DIAGNOSIS — Z3682 Encounter for antenatal screening for nuchal translucency: Secondary | ICD-10-CM

## 2020-03-06 DIAGNOSIS — Z3490 Encounter for supervision of normal pregnancy, unspecified, unspecified trimester: Secondary | ICD-10-CM | POA: Insufficient documentation

## 2020-03-07 ENCOUNTER — Other Ambulatory Visit: Payer: Self-pay

## 2020-03-07 ENCOUNTER — Ambulatory Visit (INDEPENDENT_AMBULATORY_CARE_PROVIDER_SITE_OTHER): Payer: 59

## 2020-03-07 ENCOUNTER — Ambulatory Visit: Payer: 59 | Admitting: *Deleted

## 2020-03-07 ENCOUNTER — Ambulatory Visit (INDEPENDENT_AMBULATORY_CARE_PROVIDER_SITE_OTHER): Payer: 59 | Admitting: Advanced Practice Midwife

## 2020-03-07 ENCOUNTER — Encounter: Payer: Self-pay | Admitting: Advanced Practice Midwife

## 2020-03-07 VITALS — BP 128/77 | HR 69 | Wt 256.0 lb

## 2020-03-07 DIAGNOSIS — Z3682 Encounter for antenatal screening for nuchal translucency: Secondary | ICD-10-CM

## 2020-03-07 DIAGNOSIS — Z8759 Personal history of other complications of pregnancy, childbirth and the puerperium: Secondary | ICD-10-CM | POA: Diagnosis not present

## 2020-03-07 DIAGNOSIS — Z3480 Encounter for supervision of other normal pregnancy, unspecified trimester: Secondary | ICD-10-CM | POA: Diagnosis not present

## 2020-03-07 DIAGNOSIS — Z3A13 13 weeks gestation of pregnancy: Secondary | ICD-10-CM | POA: Diagnosis not present

## 2020-03-07 DIAGNOSIS — O99841 Bariatric surgery status complicating pregnancy, first trimester: Secondary | ICD-10-CM | POA: Diagnosis not present

## 2020-03-07 DIAGNOSIS — Z331 Pregnant state, incidental: Secondary | ICD-10-CM | POA: Diagnosis not present

## 2020-03-07 DIAGNOSIS — Z1389 Encounter for screening for other disorder: Secondary | ICD-10-CM | POA: Diagnosis not present

## 2020-03-07 DIAGNOSIS — Z363 Encounter for antenatal screening for malformations: Secondary | ICD-10-CM

## 2020-03-07 MED ORDER — ASPIRIN EC 81 MG PO TBEC
162.0000 mg | DELAYED_RELEASE_TABLET | Freq: Every day | ORAL | 2 refills | Status: DC
Start: 2020-03-07 — End: 2020-09-03

## 2020-03-07 MED FILL — SM ASPIRIN EC 81 MG TABLET: 81 | 90 days supply | Qty: 180 | Fill #0

## 2020-03-07 NOTE — Progress Notes (Signed)
[place] 13+2 wks,measurements c/w dates,crl 67.57 mm,fhr 158 bpm,posterior placenta,normal ovaries,NB present,NT 1.9 mm

## 2020-03-07 NOTE — Patient Instructions (Signed)
Rebecca [name], I greatly value your feedback.  If you receive a survey following your visit with [place] today, we appreciate you taking the time to fill it out.  Thanks, [name], DNP, CNM  [facility] HAS MOVED!!! It is now [facility] ([address] [place], [place] [address]) Entrance located off of [address] Free 24/7 valet parking   Nausea & Vomiting  Have saltine crackers or pretzels by your bed and eat a few bites before you raise your head out of bed in the morning  Eat small frequent meals throughout the day instead of large meals  Drink plenty of fluids throughout the day to stay hydrated, just don't drink a lot of fluids with your meals.  This can make your stomach fill up faster making you feel sick  Do not brush your teeth right after you eat  Products with real ginger are good for nausea, like ginger ale and ginger hard candy Make sure it says made with real ginger!  Sucking on sour candy like lemon heads is also good for nausea  If your prenatal vitamins make you nauseated, take them at night so you will sleep through the nausea  Sea Bands  If you feel like you need medicine for the nausea & vomiting please let us know  If you are unable to keep any fluids or food down please let us know   Constipation  Drink plenty of fluid, preferably water, throughout the day  Eat foods high in fiber such as fruits, vegetables, and grains  Exercise, such as walking, is a good way to keep your bowels regular  Drink warm fluids, especially warm prune juice, or decaf coffee  Eat a 1/2 cup of real oatmeal (not instant), 1/2 cup applesauce, and 1/2-1 cup warm prune juice every day  If needed, you may take Colace (docusate sodium) stool softener once or twice a day to help keep the stool soft.   If you still are having problems with constipation, you may take Miralax once daily as needed to help keep your bowels regular.   Home  Blood Pressure Monitoring for Patients   Your provider has recommended that you check your blood pressure (BP) at least once a week at home. If you do not have a blood pressure cuff at home, one will be provided for you. Contact your provider if you have not received your monitor within 1 week.   Helpful Tips for Accurate Home Blood Pressure Checks   Don't smoke, exercise, or drink caffeine 30 minutes before checking your BP  Use the restroom before checking your BP (a full bladder can raise your pressure)  Relax in a comfortable upright chair  Feet on the ground  Left arm resting comfortably on a flat surface at the level of your heart  Legs uncrossed  Back supported  Sit quietly and don't talk  Place the cuff on your bare arm  Adjust snuggly, so that only two fingertips can fit between your skin and the top of the cuff  Check 2 readings separated by at least one minute  Keep a log of your BP readings  For a visual, please reference this diagram: http://ccnc.care/bpdiagram  Provider Name: [facility]     Phone: [phone]  Zone 1: ALL CLEAR  Continue to monitor your symptoms:   BP reading is less than 140 (top number) or less than 90 (bottom number)   No right upper stomach  [name]  No headaches or seeing spots  No feeling nauseated or throwing up  No swelling in face and hands  Zone 2: CAUTION Call your doctor's office for any of the following:   BP reading is greater than 140 (top number) or greater than 90 (bottom number)   Stomach [name] under your ribs in the middle or right side  Headaches or seeing spots  Feeling nauseated or throwing up  Swelling in face and hands  Zone 3: EMERGENCY  Seek immediate medical care if you have any of the following:   BP reading is greater than160 (top number) or greater than 110 (bottom number)  Severe headaches not improving with Tylenol  Serious difficulty catching your breath  Any worsening symptoms  from Zone 2    First Trimester of Pregnancy The first trimester of pregnancy is from week 1 until the end of week 12 (months 1 through 3). A week after a sperm fertilizes an egg, the egg will implant on the wall of the uterus. This embryo will begin to develop into a baby. Genes from you and your partner are forming the baby. The female genes determine whether the baby is a boy or a girl. At 6-8 weeks, the eyes and face are formed, and the heartbeat can be seen on ultrasound. At the end of 12 weeks, all the baby's organs are formed.  Now that you are pregnant, you will want to do everything you can to have a healthy baby. Two of the most important things are to get good prenatal care and to follow your health care provider's instructions. Prenatal care is all the medical care you receive before the baby's birth. This care will help prevent, find, and treat any problems during the pregnancy and childbirth. BODY CHANGES Your body goes through many changes during pregnancy. The changes vary from woman to woman.   You may gain or lose a couple of pounds at first.  You may feel sick to your stomach (nauseous) and throw up (vomit). If the vomiting is uncontrollable, call your health care provider.  You may tire easily.  You may develop headaches that can be relieved by medicines approved by your health care provider.  You may urinate more often. Painful urination may mean you have a bladder infection.  You may develop heartburn as a result of your pregnancy.  You may develop constipation because certain hormones are causing the muscles that push waste through your intestines to slow down.  You may develop hemorrhoids or swollen, bulging veins (varicose veins).  Your breasts may begin to grow larger and become tender. Your nipples may stick out more, and the tissue that surrounds them (areola) may become darker.  Your gums may bleed and may be sensitive to brushing and flossing.  Dark spots or  blotches (chloasma, mask of pregnancy) may develop on your face. This will likely fade after the baby is born.  Your menstrual periods will stop.  You may have a loss of appetite.  You may develop cravings for certain kinds of food.  You may have changes in your emotions from day to day, such as being excited to be pregnant or being concerned that something may go wrong with the pregnancy and baby.  You may have more vivid and strange dreams.  You may have changes in your hair. These can include thickening of your hair, rapid growth, and changes in texture. Some women also have hair loss during or after pregnancy, or hair that  feels dry or thin. Your hair will most likely return to normal after your baby is born. WHAT TO EXPECT AT YOUR PRENATAL VISITS During a routine prenatal visit:  You will be weighed to make sure you and the baby are growing normally.  Your blood pressure will be taken.  Your abdomen will be measured to track your baby's growth.  The fetal heartbeat will be listened to starting around week 10 or 12 of your pregnancy.  Test results from any previous visits will be discussed. Your health care provider may ask you:  How you are feeling.  If you are feeling the baby move.  If you have had any abnormal symptoms, such as leaking fluid, bleeding, severe headaches, or abdominal cramping.  If you have any questions. Other tests that may be performed during your first trimester include:  Blood tests to find your blood type and to check for the presence of any previous infections. They will also be used to check for low iron levels (anemia) and Rh antibodies. Later in the pregnancy, blood tests for diabetes will be done along with other tests if problems develop.  Urine tests to check for infections, diabetes, or protein in the urine.  An ultrasound to confirm the proper growth and development of the baby.  An amniocentesis to check for possible genetic  problems.  Fetal screens for spina bifida and Down syndrome.  You may need other tests to make sure you and the baby are doing well. HOME CARE INSTRUCTIONS  Medicines  Follow your health care provider's instructions regarding medicine use. Specific medicines may be either safe or unsafe to take during pregnancy.  Take your prenatal vitamins as directed.  If you develop constipation, try taking a stool softener if your health care provider approves. Diet  Eat regular, well-balanced meals. Choose a variety of foods, such as meat or vegetable-based protein, fish, milk and low-fat dairy products, vegetables, fruits, and whole grain breads and cereals. Your health care provider will help you determine the amount of weight gain that is right for you.  Avoid raw meat and uncooked cheese. These carry germs that can cause birth defects in the baby.  Eating four or five small meals rather than three large meals a day may help relieve nausea and vomiting. If you start to feel nauseous, eating a few soda crackers can be helpful. Drinking liquids between meals instead of during meals also seems to help nausea and vomiting.  If you develop constipation, eat more high-fiber foods, such as fresh vegetables or fruit and whole grains. Drink enough fluids to keep your urine clear or pale yellow. Activity and Exercise  Exercise only as directed by your health care provider. Exercising will help you:  Control your weight.  Stay in shape.  Be prepared for labor and delivery.  Experiencing [name] or cramping in the lower abdomen or low back is a good sign that you should stop exercising. Check with your health care provider before continuing normal exercises.  Try to avoid standing for long periods of time. Move your legs often if you must stand in one place for a long time.  Avoid heavy lifting.  Wear low-heeled shoes, and practice good posture.  You may continue to have sex unless your health care  provider directs you otherwise. Relief of [name] or Discomfort  Wear a good support bra for breast tenderness.    Take warm sitz baths to soothe any [name] or discomfort caused by hemorrhoids. Use hemorrhoid cream  if your health care provider approves.    Rest with your legs elevated if you have leg cramps or low back [name].  If you develop varicose veins in your legs, wear support hose. Elevate your feet for 15 minutes, 3-4 times a day. Limit salt in your diet. Prenatal Care  Schedule your prenatal visits by the twelfth week of pregnancy. They are usually scheduled monthly at first, then more often in the last 2 months before delivery.  Write down your questions. Take them to your prenatal visits.  Keep all your prenatal visits as directed by your health care provider. Safety  Wear your seat belt at all times when driving.  Make a list of emergency phone numbers, including numbers for family, friends, the hospital, and police and fire departments. General Tips  Ask your health care provider for a referral to a local prenatal education class. Begin classes no later than at the beginning of month 6 of your pregnancy.  Ask for help if you have counseling or nutritional needs during pregnancy. Your health care provider can offer advice or refer you to specialists for help with various needs.  Do not use hot tubs, steam rooms, or saunas.  Do not douche or use tampons or scented sanitary pads.  Do not cross your legs for long periods of time.  Avoid cat litter boxes and soil used by cats. These carry germs that can cause birth defects in the baby and possibly loss of the fetus by miscarriage or stillbirth.  Avoid all smoking, herbs, alcohol, and medicines not prescribed by your health care provider. Chemicals in these affect the formation and growth of the baby.  Schedule a dentist appointment. At home, brush your teeth with a soft toothbrush and be gentle when you floss. SEEK MEDICAL  CARE IF:   You have dizziness.  You have mild pelvic cramps, pelvic pressure, or nagging [name] in the abdominal area.  You have persistent nausea, vomiting, or diarrhea.  You have a bad smelling vaginal discharge.  You have [name] with urination.  You notice increased swelling in your face, hands, legs, or ankles. SEEK IMMEDIATE MEDICAL CARE IF:   You have a fever.  You are leaking fluid from your vagina.  You have spotting or bleeding from your vagina.  You have severe abdominal cramping or [name].  You have rapid weight gain or loss.  You vomit blood or material that looks like coffee grounds.  You are exposed to [place] measles and have never had them.  You are exposed to fifth disease or chickenpox.  You develop a severe headache.  You have shortness of breath.  You have any kind of trauma, such as from a fall or a car accident. Document Released: 07/14/2001 Document Revised: 12/04/2013 Document Reviewed: 05/30/2013 [facility] Patient Information 2015 [place], [place]. This information is not intended to replace advice given to you by your health care provider. Make sure you discuss any questions you have with your health care provider.  Coronavirus (COVID-19) Are you at risk?  Are you at risk for the Coronavirus (COVID-19)?  To be considered HIGH RISK for Coronavirus (COVID-19), you have to meet the following criteria:   Traveled to [place], [place], [place], [place] or [place]; or in the [place] to [place], [place], [place], or [place]; and have fever, cough, and shortness of breath within the last 2 weeks of travel OR  Been in close contact with a person diagnosed with COVID-19 within the last 2  weeks and have fever, cough, and shortness of breath  IF YOU DO NOT MEET THESE CRITERIA, YOU ARE CONSIDERED LOW RISK FOR COVID-19.  What to do if you are HIGH RISK for COVID-19?   If you are having a medical emergency, call 911.  Seek medical care right away.  Before you go to a doctors office, urgent care or emergency department, call ahead and tell them about your recent travel, contact with someone diagnosed with COVID-19, and your symptoms. You should receive instructions from your physicians office regarding next steps of care.   When you arrive at healthcare provider, tell the healthcare staff immediately you have returned from visiting [place], [place], [place], [place] or [place]; or traveled in the [place] to [place], [place], [place], or [place]; in the last two weeks or you have been in close contact with a person diagnosed with COVID-19 in the last 2 weeks.    Tell the health care staff about your symptoms: fever, cough and shortness of breath.  After you have been seen by a medical provider, you will be either: o Tested for (COVID-19) and discharged home on quarantine except to seek medical care if symptoms worsen, and asked to  - Stay home and avoid contact with others until you get your results (4-5 days)  - Avoid travel on public transportation if possible (such as bus, train, or airplane) or o Sent to the Emergency Department by EMS for evaluation, COVID-19 testing, and possible admission depending on your condition and test results.  What to do if you are LOW RISK for COVID-19?  Reduce your risk of any infection by using the same precautions used for avoiding the common cold or flu:   Wash your hands often with soap and warm water for at least 20 seconds.  If soap and water are not readily available, use an alcohol-based hand sanitizer with at least 60% alcohol.   If coughing or sneezing, cover your mouth and nose by coughing or sneezing into the elbow areas of your shirt or coat, into a tissue or into your sleeve (not your hands).  Avoid shaking hands with others and consider head nods or verbal greetings only.  Avoid touching your eyes, nose, or mouth with unwashed hands.   Avoid close contact with people who are  sick.  Avoid places or events with large numbers of people in one location, like concerts or sporting events.  Carefully consider travel plans you have or are making.  If you are planning any travel outside or inside the [place], visit the [facility] webpage for the latest health notices.  If you have some symptoms but not all symptoms, continue to monitor at home and seek medical attention if your symptoms worsen.  If you are having a medical emergency, call 911.   ADDITIONAL HEALTHCARE OPTIONS FOR PATIENTS  [facility] / e-Visit: [email or web address]         [facility]: [phone]  [facility]: [phone]                   [facility]: [phone]     Safe Medications in Pregnancy   Acne: Benzoyl Peroxide Salicylic Acid  Backache/Headache: Tylenol: 2 regular strength every 4 hours OR              2 Extra strength every 6 hours  Colds/Coughs/Allergies: Benadryl (alcohol free) 25 mg every 6 hours as needed Breath right strips Claritin  Cepacol throat lozenges Chloraseptic throat spray Cold-Eeze- up to three times per day Cough drops, alcohol free Flonase (by prescription only) Guaifenesin Mucinex Robitussin DM (plain only, alcohol free) Saline nasal spray/drops Sudafed (pseudoephedrine) & Actifed ** use only after [redacted] weeks gestation and if you do not have high blood pressure Tylenol Vicks Vaporub Zinc lozenges Zyrtec   Constipation: Colace Ducolax suppositories Fleet enema Glycerin suppositories Metamucil Milk of magnesia Miralax Senokot Smooth move tea  Diarrhea: Kaopectate Imodium A-D  *NO pepto Bismol  Hemorrhoids: Anusol Anusol HC Preparation H Tucks  Indigestion: Tums Maalox Mylanta Zantac  Pepcid  Insomnia: Benadryl (alcohol free) 25mg  every 6 hours as needed Tylenol PM Unisom, no Gelcaps  Leg  Cramps: Tums MagGel  Nausea/Vomiting:  Bonine Dramamine Emetrol Ginger extract Sea bands Meclizine  Nausea medication to take during pregnancy:  Unisom (doxylamine succinate 25 mg tablets) Take one tablet daily at bedtime. If symptoms are not adequately controlled, the dose can be increased to a maximum recommended dose of two tablets daily (1/2 tablet in the morning, 1/2 tablet mid-afternoon and one at bedtime). Vitamin B6 100mg  tablets. Take one tablet twice a day (up to 200 mg per day).  Skin Rashes: Aveeno products Benadryl cream or 25mg  every 6 hours as needed Calamine Lotion 1% cortisone cream  Yeast infection: Gyne-lotrimin 7 Monistat 7   **If taking multiple medications, please check labels to avoid duplicating the same active ingredients **take medication as directed on the label ** Do not exceed 4000 mg of tylenol in 24 hours **Do not take medications that contain aspirin or ibuprofen

## 2020-03-07 NOTE — Progress Notes (Signed)
INITIAL OBSTETRICAL VISIT Patient name: Rebecca [name] MRN [id number]  Date of birth: 1986-04-15 Chief Complaint:   Initial Prenatal Visit  History of Present Illness:   Rebecca [name] is a 34 y.o. G87P1001  female at [redacted]w[redacted]d by LMP c/w u/s at 6 weeks with an Estimated Date of Delivery: 09/10/20 being seen today for her initial obstetrical visit.   Her obstetrical history is significant for postpartum GHTN. Today she reports (possibly) wanting a pregnancy exemption from the Covid vaccine [employer] employee).  Counseled about safety of vaccine.  She was counseled on the risks and benefits of COVID vaccination during today's visit. The patient's questions and concerns were addressed today, including fetal risks.. She had Covid 11/20, so recommended getting antibodies checked as a way to help her decide.  Depression screen [facility] 2/9 03/07/2020 01/04/2020 02/07/2019 05/03/2017 03/29/2017  Decreased Interest 0 0 0 0 0  Down, Depressed, Hopeless 0 0 0 0 0  PHQ - 2 Score 0 0 0 0 0  Altered sleeping 0 - 0 0 -  Tired, decreased energy 0 - 0 1 -  Change in appetite 0 - 0 0 -  Feeling bad or failure about yourself  0 - 0 0 -  Trouble concentrating 0 - 0 0 -  Moving slowly or fidgety/restless 0 - 0 0 -  Suicidal thoughts 0 - 0 0 -  PHQ-9 Score 0 - 0 1 -  Difficult doing work/chores - - Not difficult at all Not difficult at all -    Patient's last menstrual period was 12/05/2019. Last pap 05/03/17. Results were: normal Review of Systems:   Pertinent items are noted in HPI Denies cramping/contractions, leakage of fluid, vaginal bleeding, abnormal vaginal discharge w/ itching/odor/irritation, headaches, visual changes, shortness of breath, chest pain, abdominal pain, severe nausea/vomiting, or problems with urination or bowel movements unless otherwise stated above.  Pertinent History Reviewed:  Reviewed past medical,surgical, social, obstetrical and family history.  Reviewed problem list, medications and allergies. OB  History  Gravida Para Term Preterm AB Living  2 1 1     1   SAB TAB Ectopic Multiple Live Births        0 1    # Outcome Date GA Lbr Len/2nd Weight Sex Delivery Anes PTL Lv  2 Current           1 Term 11/21/17 [redacted]w[redacted]d 10:30 / 01:46 7 lb 7.6 oz (3.391 kg) M Vag-Spont EPI  LIV   Physical Assessment:   Vitals:   03/07/20 1520  BP: 128/77  Pulse: 69  Weight: 256 lb (116.1 kg)  Body mass index is 34.72 kg/m.       Physical Examination:  General appearance - well appearing, and in no distress  Mental status - alert, oriented to person, place, and time  Psych:  She has a normal mood and affect  Skin - warm and dry, normal color, no suspicious lesions noted  Chest - effort normal  Heart - normal rate and regular rhythm  Abdomen - soft, nontender  Extremities:  No swelling or varicosities noted    TODAY'S NT 05/07/20 13+2 wks,measurements c/w dates,crl 67.57 mm,fhr 158 bpm,posterior placenta,normal ovaries,NB present,NT 1.9 mm  No results found for this or any previous visit (from the past 24 hour(s)).  Assessment & Plan:  1) Low-Risk Pregnancy G2P1001 at [redacted]w[redacted]d with an Estimated Date of Delivery: 09/10/20   2) Initial OB visit  3) Hx GHTN:  Start ASA  4)  Exemption papers  filled out for [facility] regarding Covid Vaccine:  Pt still undecided as to whether she will get it.   Meds:  Meds ordered this encounter  Medications  . aspirin EC 81 MG tablet    Sig: Take 2 tablets (162 mg total) by mouth daily.    Dispense:  180 tablet    Refill:  2    Order Specific Question:   Supervising Provider    Answer:   Duane Lope H [[id number]]    Initial labs obtained Continue prenatal vitamins Reviewed n/v relief measures and warning s/s to report Reviewed recommended weight gain based on pre-gravid BMI Encouraged well-balanced diet Genetic & carrier screening discussed: requests NT/IT, declines Panorama and Horizon 14  Ultrasound discussed; fetal survey: requested [facility] completed> form faxed if has or  is planning to apply for medicaid The nature of [employer] for [employer] with multiple MDs and other Advanced Practice Providers was explained to patient; also emphasized that fellows, residents, and students are part of our team.         Scarlette Calico Cresenzo-Dishmon 7:30 PM

## 2020-03-09 LAB — COMPREHENSIVE METABOLIC PANEL
ALT: 13 IU/L (ref 0–32)
AST: 19 IU/L (ref 0–40)
Albumin/Globulin Ratio: 1.8 (ref 1.2–2.2)
Albumin: 4.2 g/dL (ref 3.8–4.8)
Alkaline Phosphatase: 77 IU/L (ref 48–121)
BUN/Creatinine Ratio: 16 (ref 9–23)
BUN: 11 mg/dL (ref 6–20)
Bilirubin Total: 0.4 mg/dL (ref 0.0–1.2)
CO2: 21 mmol/L (ref 20–29)
Calcium: 9 mg/dL (ref 8.7–10.2)
Chloride: 101 mmol/L (ref 96–106)
Creatinine, Ser: 0.68 mg/dL (ref 0.57–1.00)
GFR calc Af Amer: 132 mL/min/{1.73_m2} (ref >59)
GFR calc non Af Amer: 114 mL/min/{1.73_m2} (ref >59)
Globulin, Total: 2.4 g/dL (ref 1.5–4.5)
Glucose: 95 mg/dL (ref 65–99)
Potassium: 4.8 mmol/L (ref 3.5–5.2)
Sodium: 135 mmol/L (ref 134–144)
Total Protein: 6.6 g/dL (ref 6.0–8.5)

## 2020-03-09 LAB — PROTEIN / CREATININE RATIO, URINE
Creatinine, Urine: 56.4 mg/dL
Protein, Ur: <4 mg/dL

## 2020-03-09 LAB — CBC/D/PLT+RPR+RH+ABO+RUB AB...
Antibody Screen: NEGATIVE
Basophils Absolute: 0 10*3/uL (ref 0.0–0.2)
Basos: 0 %
EOS (ABSOLUTE): 0.1 10*3/uL (ref 0.0–0.4)
Eos: 1 %
HCV Ab: <0.1 s/co ratio (ref 0.0–0.9)
HIV Screen 4th Generation wRfx: NONREACTIVE
Hematocrit: 38.5 % (ref 34.0–46.6)
Hemoglobin: 12.1 g/dL (ref 11.1–15.9)
Hepatitis B Surface Ag: NEGATIVE
Immature Grans (Abs): 0 10*3/uL (ref 0.0–0.1)
Immature Granulocytes: 0 %
Lymphocytes Absolute: 2.3 10*3/uL (ref 0.7–3.1)
Lymphs: 23 %
MCH: 26.2 pg — ABNORMAL LOW (ref 26.6–33.0)
MCHC: 31.4 g/dL — ABNORMAL LOW (ref 31.5–35.7)
MCV: 83 fL (ref 79–97)
Monocytes Absolute: 0.7 10*3/uL (ref 0.1–0.9)
Monocytes: 7 %
Neutrophils Absolute: 6.8 10*3/uL (ref 1.4–7.0)
Neutrophils: 69 %
Platelets: 233 10*3/uL (ref 150–450)
RBC: 4.62 x10E6/uL (ref 3.77–5.28)
RDW: 18.9 % — ABNORMAL HIGH (ref 11.7–15.4)
RPR Ser Ql: NONREACTIVE
Rh Factor: POSITIVE
Rubella Antibodies, IGG: <0.9 index — ABNORMAL LOW (ref >0.99)
WBC: 9.9 10*3/uL (ref 3.4–10.8)

## 2020-03-09 LAB — INTEGRATED 1
Crown Rump Length: 67.6 mm
Gest. Age on Collection Date: 12.9 weeks
Maternal Age at EDD: 34.9 yr
Nuchal Translucency (NT): 1.9 mm
Number of Fetuses: 1
PAPP-A Value: 288.8 ng/mL
Weight: 256 [lb_av]

## 2020-03-09 LAB — HCV INTERPRETATION

## 2020-03-10 LAB — GC/CHLAMYDIA PROBE AMP
Chlamydia trachomatis, NAA: NEGATIVE
Neisseria Gonorrhoeae by PCR: NEGATIVE

## 2020-03-12 ENCOUNTER — Other Ambulatory Visit: Payer: Self-pay | Admitting: Advanced Practice Midwife

## 2020-03-12 LAB — URINE CULTURE

## 2020-03-12 LAB — SPECIMEN STATUS REPORT

## 2020-03-12 MED ORDER — NITROFURANTOIN MONOHYD MACRO 100 MG PO CAPS
100.0000 mg | ORAL_CAPSULE | Freq: Two times a day (BID) | ORAL | 0 refills | Status: DC
Start: 2020-03-12 — End: 2020-06-03

## 2020-03-12 NOTE — Progress Notes (Unsigned)
macrobid for ASB 

## 2020-04-10 ENCOUNTER — Ambulatory Visit (INDEPENDENT_AMBULATORY_CARE_PROVIDER_SITE_OTHER): Payer: 59 | Admitting: Advanced Practice Midwife

## 2020-04-10 ENCOUNTER — Encounter: Payer: Self-pay | Admitting: Advanced Practice Midwife

## 2020-04-10 ENCOUNTER — Ambulatory Visit (INDEPENDENT_AMBULATORY_CARE_PROVIDER_SITE_OTHER): Payer: 59

## 2020-04-10 VITALS — BP 129/82 | HR 65 | Wt 259.0 lb

## 2020-04-10 DIAGNOSIS — Z283 Underimmunization status: Secondary | ICD-10-CM

## 2020-04-10 DIAGNOSIS — Z363 Encounter for antenatal screening for malformations: Secondary | ICD-10-CM | POA: Diagnosis not present

## 2020-04-10 DIAGNOSIS — Z3A18 18 weeks gestation of pregnancy: Secondary | ICD-10-CM

## 2020-04-10 DIAGNOSIS — O99891 Other specified diseases and conditions complicating pregnancy: Secondary | ICD-10-CM

## 2020-04-10 DIAGNOSIS — O09899 Supervision of other high risk pregnancies, unspecified trimester: Secondary | ICD-10-CM

## 2020-04-10 DIAGNOSIS — Z3480 Encounter for supervision of other normal pregnancy, unspecified trimester: Secondary | ICD-10-CM

## 2020-04-10 LAB — POCT URINALYSIS DIPSTICK OB
Blood, UA: NEGATIVE
Glucose, UA: NEGATIVE
Ketones, UA: NEGATIVE
Leukocytes, UA: NEGATIVE
Nitrite, UA: NEGATIVE
POC,PROTEIN,UA: NEGATIVE

## 2020-04-10 NOTE — Patient Instructions (Signed)
Rebecca [name], I greatly value your feedback.  If you receive a survey following your visit with [place] today, we appreciate you taking the time to fill it out.  Thanks, [name], CNM  [facility] ([address] [place], [place] [address]) Entrance C, located off of [address] valet parking  Go to [email or web address] to register for FREE online childbirth classes  [place] Pediatricians/Family Doctors:  [facility] [phone]            [facility] [phone]                 [facility] [phone] (usually not accepting new patients unless you have family there already, you are always welcome to call and ask)       [facility] [phone]       [facility] Pediatricians/Family Doctors:   [facility]: [phone]  [facility]: [phone]  [facility]: [phone]  [facility] Doctors:   [facility]: [phone]   [facility]: [phone]  [facility] Doctors:  [facility]: [phone]    Home Blood Pressure Monitoring for Patients   Your provider has recommended that you check your blood pressure (BP) at least once a week at home. If you do not have a blood pressure cuff at home, one will be provided for you. Contact your provider if you have not received your monitor within 1 week.   Helpful Tips for Accurate Home Blood Pressure Checks  . Don't smoke, exercise, or drink caffeine 30 minutes before checking your BP . Use the restroom before checking your BP (a full bladder can raise your pressure) . Relax in a comfortable upright chair . Feet on the ground . Left arm resting comfortably on a flat surface at the level of your heart . Legs uncrossed . Back supported . Sit quietly and don't talk . Place the cuff on your bare arm . Adjust snuggly, so that only two  fingertips can fit between your skin and the top of the cuff . Check 2 readings separated by at least one minute . Keep a log of your BP readings . For a visual, please reference this diagram: http://ccnc.care/bpdiagram  Provider Name: [facility]     Phone: [phone]  Zone 1: ALL CLEAR  Continue to monitor your symptoms:  . BP reading is less than 140 (top number) or less than 90 (bottom number)  . No right upper stomach [name] . No headaches or seeing spots . No feeling nauseated or throwing up . No swelling in face and hands  Zone 2: CAUTION Call your doctor's office for any of the following:  . BP reading is greater than 140 (top number) or greater than 90 (bottom number)  . Stomach [name] under your ribs in the middle or right side . Headaches or seeing spots . Feeling nauseated or throwing up . Swelling in face and hands  Zone 3: EMERGENCY  Seek immediate medical care if you have any of the following:  . BP reading is greater than160 (top number) or greater than 110 (bottom number) . Severe headaches not improving with Tylenol . Serious difficulty catching your breath . Any worsening symptoms from Zone 2     Second Trimester of Pregnancy The second trimester is from week 14 through week 27 (months 4 through 6). The second trimester is often a time when you feel your best.  Your body has adjusted to being pregnant, and you begin to feel better physically. Usually, morning sickness has lessened or quit completely, you may have more energy, and you may have an increase in appetite. The second trimester is also a time when the fetus is growing rapidly. At the end of the sixth month, the fetus is about 9 inches long and weighs about 1 pounds. You will likely begin to feel the baby move (quickening) between 16 and 20 weeks of pregnancy. Body changes during your second trimester Your body continues to go through many changes during your second trimester. The changes vary from  woman to woman.  Your weight will continue to increase. You will notice your lower abdomen bulging out.  You may begin to get stretch marks on your hips, abdomen, and breasts.  You may develop headaches that can be relieved by medicines. The medicines should be approved by your health care provider.  You may urinate more often because the fetus is pressing on your bladder.  You may develop or continue to have heartburn as a result of your pregnancy.  You may develop constipation because certain hormones are causing the muscles that push waste through your intestines to slow down.  You may develop hemorrhoids or swollen, bulging veins (varicose veins).  You may have back [name]. This is caused by: ? Weight gain. ? Pregnancy hormones that are relaxing the joints in your pelvis. ? A shift in weight and the muscles that support your balance.  Your breasts will continue to grow and they will continue to become tender.  Your gums may bleed and may be sensitive to brushing and flossing.  Dark spots or blotches (chloasma, mask of pregnancy) may develop on your face. This will likely fade after the baby is born.  A dark line from your belly button to the pubic area (linea nigra) may appear. This will likely fade after the baby is born.  You may have changes in your hair. These can include thickening of your hair, rapid growth, and changes in texture. Some women also have hair loss during or after pregnancy, or hair that feels dry or thin. Your hair will most likely return to normal after your baby is born.  What to expect at prenatal visits During a routine prenatal visit:  You will be weighed to make sure you and the fetus are growing normally.  Your blood pressure will be taken.  Your abdomen will be measured to track your baby's growth.  The fetal heartbeat will be listened to.  Any test results from the previous visit will be discussed.  Your health care provider may ask  you:  How you are feeling.  If you are feeling the baby move.  If you have had any abnormal symptoms, such as leaking fluid, bleeding, severe headaches, or abdominal cramping.  If you are using any tobacco products, including cigarettes, chewing tobacco, and electronic cigarettes.  If you have any questions.  Other tests that may be performed during your second trimester include:  Blood tests that check for: ? Low iron levels (anemia). ? High blood sugar that affects pregnant women (gestational diabetes) between 38 and 28 weeks. ? Rh antibodies. This is to check for a protein on red blood cells (Rh factor).  Urine tests to check for infections, diabetes, or protein in the urine.  An ultrasound to confirm the proper growth and development of the baby.  An amniocentesis to check for possible genetic problems.  Fetal screens  for spina bifida and Down syndrome.  HIV (human immunodeficiency virus) testing. Routine prenatal testing includes screening for HIV, unless you choose not to have this test.  Follow these instructions at home: Medicines  Follow your health care provider's instructions regarding medicine use. Specific medicines may be either safe or unsafe to take during pregnancy.  Take a prenatal vitamin that contains at least 600 micrograms (mcg) of folic acid.  If you develop constipation, try taking a stool softener if your health care provider approves. Eating and drinking  Eat a balanced diet that includes fresh fruits and vegetables, whole grains, good sources of protein such as meat, eggs, or tofu, and low-fat dairy. Your health care provider will help you determine the amount of weight gain that is right for you.  Avoid raw meat and uncooked cheese. These carry germs that can cause birth defects in the baby.  If you have low calcium intake from food, talk to your health care provider about whether you should take a daily calcium supplement.  Limit foods that  are high in fat and processed sugars, such as fried and sweet foods.  To prevent constipation: ? Drink enough fluid to keep your urine clear or pale yellow. ? Eat foods that are high in fiber, such as fresh fruits and vegetables, whole grains, and beans. Activity  Exercise only as directed by your health care provider. Most women can continue their usual exercise routine during pregnancy. Try to exercise for 30 minutes at least 5 days a week. Stop exercising if you experience uterine contractions.  Avoid heavy lifting, wear low heel shoes, and practice good posture.  A sexual relationship may be continued unless your health care provider directs you otherwise. Relieving [name] and discomfort  Wear a good support bra to prevent discomfort from breast tenderness.  Take warm sitz baths to soothe any [name] or discomfort caused by hemorrhoids. Use hemorrhoid cream if your health care provider approves.  Rest with your legs elevated if you have leg cramps or low back [name].  If you develop varicose veins, wear support hose. Elevate your feet for 15 minutes, 3-4 times a day. Limit salt in your diet. Prenatal Care  Write down your questions. Take them to your prenatal visits.  Keep all your prenatal visits as told by your health care provider. This is important. Safety  Wear your seat belt at all times when driving.  Make a list of emergency phone numbers, including numbers for family, friends, the hospital, and police and fire departments. General instructions  Ask your health care provider for a referral to a local prenatal education class. Begin classes no later than the beginning of month 6 of your pregnancy.  Ask for help if you have counseling or nutritional needs during pregnancy. Your health care provider can offer advice or refer you to specialists for help with various needs.  Do not use hot tubs, steam rooms, or saunas.  Do not douche or use tampons or scented sanitary  pads.  Do not cross your legs for long periods of time.  Avoid cat litter boxes and soil used by cats. These carry germs that can cause birth defects in the baby and possibly loss of the fetus by miscarriage or stillbirth.  Avoid all smoking, herbs, alcohol, and unprescribed drugs. Chemicals in these products can affect the formation and growth of the baby.  Do not use any products that contain nicotine or tobacco, such as cigarettes and e-cigarettes. If you need help quitting,  ask your health care provider.  Visit your dentist if you have not gone yet during your pregnancy. Use a soft toothbrush to brush your teeth and be gentle when you floss. Contact a health care provider if:  You have dizziness.  You have mild pelvic cramps, pelvic pressure, or nagging [name] in the abdominal area.  You have persistent nausea, vomiting, or diarrhea.  You have a bad smelling vaginal discharge.  You have [name] when you urinate. Get help right away if:  You have a fever.  You are leaking fluid from your vagina.  You have spotting or bleeding from your vagina.  You have severe abdominal cramping or [name].  You have rapid weight gain or weight loss.  You have shortness of breath with chest [name].  You notice sudden or extreme swelling of your face, hands, ankles, feet, or legs.  You have not felt your baby move in over an hour.  You have severe headaches that do not go away when you take medicine.  You have vision changes. Summary  The second trimester is from week 14 through week 27 (months 4 through 6). It is also a time when the fetus is growing rapidly.  Your body goes through many changes during pregnancy. The changes vary from woman to woman.  Avoid all smoking, herbs, alcohol, and unprescribed drugs. These chemicals affect the formation and growth your baby.  Do not use any tobacco products, such as cigarettes, chewing tobacco, and e-cigarettes. If you need help quitting, ask your  health care provider.  Contact your health care provider if you have any questions. Keep all prenatal visits as told by your health care provider. This is important. This information is not intended to replace advice given to you by your health care provider. Make sure you discuss any questions you have with your health care provider. Document Released: 07/14/2001 Document Revised: 12/26/2015 Document Reviewed: 09/20/2012 Elsevier Interactive Patient Education  2017 [employer].

## 2020-04-10 NOTE — Progress Notes (Signed)
   LOW-RISK PREGNANCY VISIT Patient name: Rebecca [name] MRN [id number]  Date of birth: 1986/08/01 Chief Complaint:   Routine Prenatal Visit (anatomy scan)  History of Present Illness:   Rebecca [name] is a 34 y.o. G67P1001 female at 70w1dwith an Estimated Date of Delivery: 09/10/20 being seen today for ongoing management of a low-risk pregnancy.  Today she reports doing well overall. Contractions: Not present. Vag. Bleeding: None.  Movement: Present. denies leaking of fluid. Review of Systems:   Pertinent items are noted in HPI Denies abnormal vaginal discharge w/ itching/odor/irritation, headaches, visual changes, shortness of breath, chest pain, abdominal pain, severe nausea/vomiting, or problems with urination or bowel movements unless otherwise stated above. Pertinent History Reviewed:  Reviewed past medical,surgical, social, obstetrical and family history.  Reviewed problem list, medications and allergies. Physical Assessment:   Vitals:   04/10/20 1421  BP: 129/82  Pulse: 65  Weight: 259 lb (117.5 kg)  Body mass index is 35.13 kg/m.        Physical Examination:   General appearance: Well appearing, and in no distress  Mental status: Alert, oriented to person, place, and time  Skin: Warm & dry  Cardiovascular: Normal heart rate noted  Respiratory: Normal respiratory effort, no distress  Abdomen: Soft, gravid, nontender  Pelvic: Cervical exam deferred         Extremities: Edema: None  Fetal Status: Fetal Heart Rate (bpm): 161 u/s   Movement: Present     Anatomy u/s: [place]18+1 wks,breech,posterior placenta gr 0,cx 4 cm,normal ovaries,fhr 161 bpm,svp of fluid 3.7 cm,efw 239 g 61%,anatomy complete,no obvious abnormalities   Results for orders placed or performed in visit on 03/07/20 (from the past 24 hour(s))  POC Urinalysis Dipstick OB   Collection Time: 04/10/20  2:21 PM  Result Value Ref Range   Color, UA     Clarity, UA     Glucose, UA Negative Negative   Bilirubin, UA      Ketones, UA neg    Spec Grav, UA     Blood, UA neg    pH, UA     POC,PROTEIN,UA Negative Negative, Trace, Small (1+), Moderate (2+), Large (3+), 4+   Urobilinogen, UA     Nitrite, UA neg    Leukocytes, UA Negative Negative   Appearance     Odor      Assessment & Plan:  1) Low-risk pregnancy G2P1001 at 167w1dith an Estimated Date of Delivery: 09/10/20   2) Reviewed nonimmune to rubella, rec MMR pp  3) Hx gHTN, taking bASA daily   Meds: No orders of the defined types were placed in this encounter.  Labs/procedures today: (didn't get 2nd IT- MyChart message to let usKoreanow if she wants to do it)  Plan:  Continue routine obstetrical care   Reviewed: Preterm labor symptoms and general obstetric precautions including but not limited to vaginal bleeding, contractions, leaking of fluid and fetal movement were reviewed in detail with the patient.  All questions were answered. Has home bp cuff. Check bp weekly, let usKoreanow if >140/90.   Follow-up: Return in about 4 weeks (around 05/08/2020) [place]  No orders of the defined types were placed in this encounter.  KiMyrtis SerNM 04/10/2020 4:09 PM

## 2020-04-10 NOTE — Progress Notes (Signed)
[place] 18+1 wks,breech,posterior placenta gr 0,cx 4 cm,normal ovaries,fhr 161 bpm,svp of fluid 3.7 cm,efw 239 g 61%,anatomy complete,no obvious abnormalities

## 2020-05-08 ENCOUNTER — Encounter: Payer: Self-pay | Admitting: Advanced Practice Midwife

## 2020-05-08 ENCOUNTER — Ambulatory Visit (INDEPENDENT_AMBULATORY_CARE_PROVIDER_SITE_OTHER): Payer: 59 | Admitting: Advanced Practice Midwife

## 2020-05-08 ENCOUNTER — Other Ambulatory Visit (HOSPITAL_COMMUNITY)
Admission: RE | Admit: 2020-05-08 | Discharge: 2020-05-08 | Disposition: A | Discharge status: Home / Self Care | Payer: 59 | Source: Ambulatory Visit | Attending: Advanced Practice Midwife | Admitting: Advanced Practice Midwife

## 2020-05-08 VITALS — BP 124/82 | HR 69 | Wt 265.0 lb

## 2020-05-08 DIAGNOSIS — Z3A13 13 weeks gestation of pregnancy: Secondary | ICD-10-CM | POA: Diagnosis not present

## 2020-05-08 DIAGNOSIS — Z3480 Encounter for supervision of other normal pregnancy, unspecified trimester: Secondary | ICD-10-CM | POA: Diagnosis not present

## 2020-05-08 DIAGNOSIS — Z124 Encounter for screening for malignant neoplasm of cervix: Secondary | ICD-10-CM | POA: Insufficient documentation

## 2020-05-08 DIAGNOSIS — Z1379 Encounter for other screening for genetic and chromosomal anomalies: Secondary | ICD-10-CM

## 2020-05-08 DIAGNOSIS — Z3A22 22 weeks gestation of pregnancy: Secondary | ICD-10-CM

## 2020-05-08 DIAGNOSIS — Z331 Pregnant state, incidental: Secondary | ICD-10-CM

## 2020-05-08 DIAGNOSIS — Z1389 Encounter for screening for other disorder: Secondary | ICD-10-CM

## 2020-05-08 LAB — POCT URINALYSIS DIPSTICK OB
Blood, UA: NEGATIVE
Glucose, UA: NEGATIVE
Ketones, UA: NEGATIVE
Leukocytes, UA: NEGATIVE
Nitrite, UA: NEGATIVE
POC,PROTEIN,UA: NEGATIVE

## 2020-05-08 NOTE — Progress Notes (Signed)
   LOW-RISK PREGNANCY VISIT Patient name: Rebecca [name] MRN [id number]  Date of birth: [birth year] Chief Complaint:   Routine Prenatal Visit (2nd IT)  History of Present Illness:   Rebecca [name] is a 34 y.o. G48P1001 female at [redacted]w[redacted]d with an Estimated Date of Delivery: 09/10/20 being seen today for ongoing management of a low-risk pregnancy.  Today she reports doing well!. Contractions: Not present.  .  Movement: Present. denies leaking of fluid. Review of Systems:   Pertinent items are noted in HPI Denies abnormal vaginal discharge w/ itching/odor/irritation, headaches, visual changes, shortness of breath, chest pain, abdominal pain, severe nausea/vomiting, or problems with urination or bowel movements unless otherwise stated above. Pertinent History Reviewed:  Reviewed past medical,surgical, social, obstetrical and family history.  Reviewed problem list, medications and allergies. Physical Assessment:   Vitals:   05/08/20 1407  BP: 124/82  Pulse: 69  Weight: 265 lb (120.2 kg)  Body mass index is 35.94 kg/m.        Physical Examination:   General appearance: Well appearing, and in no distress  Mental status: Alert, oriented to person, place, and time  Skin: Warm & dry  Cardiovascular: Normal heart rate noted  Respiratory: Normal respiratory effort, no distress  Abdomen: Soft, gravid, nontender  Pelvic: Cervical exam deferred; Pap collected        Extremities: Edema: None  Fetal Status: Fetal Heart Rate (bpm): 154 Fundal Height: 23 cm Movement: Present    Results for orders placed or performed in visit on 05/08/20 (from the past 24 hour(s))  POC Urinalysis Dipstick OB   Collection Time: 05/08/20  2:19 PM  Result Value Ref Range   Color, UA     Clarity, UA     Glucose, UA Negative Negative   Bilirubin, UA     Ketones, UA neg    Spec Grav, UA     Blood, UA neg    pH, UA     POC,PROTEIN,UA Negative Negative, Trace, Small (1+), Moderate (2+), Large (3+), 4+   Urobilinogen, UA       Nitrite, UA neg    Leukocytes, UA Negative Negative   Appearance     Odor      Assessment & Plan:  1) Low-risk pregnancy G2P1001 at [redacted]w[redacted]d with an Estimated Date of Delivery: 09/10/20   2) Hx gHTN, stable BP, on bASA   Meds: No orders of the defined types were placed in this encounter.  Labs/procedures today: 2nd IT (as long as within time range)  Plan:  Continue routine obstetrical care   Reviewed: Preterm labor symptoms and general obstetric precautions including but not limited to vaginal bleeding, contractions, leaking of fluid and fetal movement were reviewed in detail with the patient.  All questions were answered. Has home bp cuff. Check bp weekly, let us know if >140/90.   Follow-up: Return in about 4 weeks (around 06/05/2020) for LROB, PN2, in person.  Orders Placed This Encounter  Procedures  . INTEGRATED 2  . POC Urinalysis Dipstick OB   Arabella Merles [facility] 05/08/2020 2:47 PM

## 2020-05-08 NOTE — Patient Instructions (Signed)
Rebecca [name], I greatly value your feedback.  If you receive a survey following your visit with [place] today, we appreciate you taking the time to fill it out.  Thanks, [name], CNM   You will have your sugar test next visit.  Please do not eat or drink anything after midnight the night before you come, not even water.  You will be here for at least two hours.  Please make an appointment online for the bloodwork at [email or web address] for 8:30am (or as close to this as possible). Make sure you select the [facility] service center. The day of the appointment, check in with our office first, then you will go to [facility] to start the sugar test.    [facility] HAS MOVED!!! It is now [facility] ([address] [place], [place] [address]) Entrance C, located off of [address] valet parking  Go to [email or web address] to register for FREE online childbirth classes   Call the office [phone]) or go to [facility] if:  You begin to have strong, frequent contractions  Your water breaks.  Sometimes it is a big gush of fluid, sometimes it is just a trickle that keeps getting your panties wet or running down your legs  You have vaginal bleeding.  It is normal to have a small amount of spotting if your cervix was checked.   You don't feel your baby moving like normal.  If you don't, get you something to eat and drink and lay down and focus on feeling your baby move.   If your baby is still not moving like normal, you should call the office or go to [facility]  [place] Pediatricians/Family Doctors:  [facility] [phone]            [facility] [phone]                 [facility] [phone] (usually not accepting new patients unless you have family there already, you are always welcome to call and ask)       [facility] Department [phone]       [facility] Pediatricians/Family Doctors:    [facility]: [phone]  [facility]: [phone]  [facility]: [phone]  [facility] Doctors:   [facility]: [phone]   [facility]: [phone]  [facility] Doctors:  [facility]: [phone]   Home Blood Pressure Monitoring for Patients   Your provider has recommended that you check your blood pressure (BP) at least once a week at home. If you do not have a blood pressure cuff at home, one will be provided for you. Contact your provider if you have not received your monitor within 1 week.   Helpful Tips for Accurate Home Blood Pressure Checks   Don't smoke, exercise, or drink caffeine 30 minutes before checking your BP  Use the restroom before checking your BP (a full bladder can raise your pressure)  Relax in a comfortable upright chair  Feet on the ground  Left arm resting comfortably on a flat surface at the level of your heart  Legs uncrossed  Back supported  Sit quietly and don't talk  Place the cuff on your bare arm  Adjust snuggly, so that only two fingertips can fit between your skin and the top of the cuff  Check 2 readings separated by at least one minute  Keep a log of your BP  readings  For a visual, please reference this diagram: http://ccnc.care/bpdiagram  Provider Name: [facility]     Phone: [phone]  Zone 1: ALL CLEAR  Continue to monitor your symptoms:   BP reading is less than 140 (top number) or less than 90 (bottom number)   No right upper stomach [name]  No headaches or seeing spots  No feeling nauseated or throwing up  No swelling in face and hands  Zone 2: CAUTION Call your doctor's office for any of the following:   BP reading is greater than 140 (top number) or greater than 90 (bottom number)   Stomach [name] under your ribs in the middle or right side  Headaches or seeing spots  Feeling  nauseated or throwing up  Swelling in face and hands  Zone 3: EMERGENCY  Seek immediate medical care if you have any of the following:   BP reading is greater than160 (top number) or greater than 110 (bottom number)  Severe headaches not improving with Tylenol  Serious difficulty catching your breath  Any worsening symptoms from Zone 2   Second Trimester of Pregnancy The second trimester is from week 13 through week 28, months 4 through 6. The second trimester is often a time when you feel your best. Your body has also adjusted to being pregnant, and you begin to feel better physically. Usually, morning sickness has lessened or quit completely, you may have more energy, and you may have an increase in appetite. The second trimester is also a time when the fetus is growing rapidly. At the end of the sixth month, the fetus is about 9 inches long and weighs about 1 pounds. You will likely begin to feel the baby move (quickening) between 18 and 20 weeks of the pregnancy. BODY CHANGES Your body goes through many changes during pregnancy. The changes vary from woman to woman.   Your weight will continue to increase. You will notice your lower abdomen bulging out.  You may begin to get stretch marks on your hips, abdomen, and breasts.  You may develop headaches that can be relieved by medicines approved by your health care provider.  You may urinate more often because the fetus is pressing on your bladder.  You may develop or continue to have heartburn as a result of your pregnancy.  You may develop constipation because certain hormones are causing the muscles that push waste through your intestines to slow down.  You may develop hemorrhoids or swollen, bulging veins (varicose veins).  You may have back [name] because of the weight gain and pregnancy hormones relaxing your joints between the bones in your pelvis and as a result of a shift in weight and the muscles that support your  balance.  Your breasts will continue to grow and be tender.  Your gums may bleed and may be sensitive to brushing and flossing.  Dark spots or blotches (chloasma, mask of pregnancy) may develop on your face. This will likely fade after the baby is born.  A dark line from your belly button to the pubic area (linea nigra) may appear. This will likely fade after the baby is born.  You may have changes in your hair. These can include thickening of your hair, rapid growth, and changes in texture. Some women also have hair loss during or after pregnancy, or hair that feels dry or thin. Your hair will most likely return to normal after your baby is born. WHAT TO EXPECT AT YOUR PRENATAL VISITS During  a routine prenatal visit:  You will be weighed to make sure you and the fetus are growing normally.  Your blood pressure will be taken.  Your abdomen will be measured to track your baby's growth.  The fetal heartbeat will be listened to.  Any test results from the previous visit will be discussed. Your health care provider may ask you:  How you are feeling.  If you are feeling the baby move.  If you have had any abnormal symptoms, such as leaking fluid, bleeding, severe headaches, or abdominal cramping.  If you have any questions. Other tests that may be performed during your second trimester include:  Blood tests that check for:  Low iron levels (anemia).  Gestational diabetes (between 24 and 28 weeks).  Rh antibodies.  Urine tests to check for infections, diabetes, or protein in the urine.  An ultrasound to confirm the proper growth and development of the baby.  An amniocentesis to check for possible genetic problems.  Fetal screens for spina bifida and Down syndrome. HOME CARE INSTRUCTIONS   Avoid all smoking, herbs, alcohol, and unprescribed drugs. These chemicals affect the formation and growth of the baby.  Follow your health care provider's instructions regarding  medicine use. There are medicines that are either safe or unsafe to take during pregnancy.  Exercise only as directed by your health care provider. Experiencing uterine cramps is a good sign to stop exercising.  Continue to eat regular, healthy meals.  Wear a good support bra for breast tenderness.  Do not use hot tubs, steam rooms, or saunas.  Wear your seat belt at all times when driving.  Avoid raw meat, uncooked cheese, cat litter boxes, and soil used by cats. These carry germs that can cause birth defects in the baby.  Take your prenatal vitamins.  Try taking a stool softener (if your health care provider approves) if you develop constipation. Eat more high-fiber foods, such as fresh vegetables or fruit and whole grains. Drink plenty of fluids to keep your urine clear or pale yellow.  Take warm sitz baths to soothe any [name] or discomfort caused by hemorrhoids. Use hemorrhoid cream if your health care provider approves.  If you develop varicose veins, wear support hose. Elevate your feet for 15 minutes, 3-4 times a day. Limit salt in your diet.  Avoid heavy lifting, wear low heel shoes, and practice good posture.  Rest with your legs elevated if you have leg cramps or low back [name].  Visit your dentist if you have not gone yet during your pregnancy. Use a soft toothbrush to brush your teeth and be gentle when you floss.  A sexual relationship may be continued unless your health care provider directs you otherwise.  Continue to go to all your prenatal visits as directed by your health care provider. SEEK MEDICAL CARE IF:   You have dizziness.  You have mild pelvic cramps, pelvic pressure, or nagging [name] in the abdominal area.  You have persistent nausea, vomiting, or diarrhea.  You have a bad smelling vaginal discharge.  You have [name] with urination. SEEK IMMEDIATE MEDICAL CARE IF:   You have a fever.  You are leaking fluid from your vagina.  You have spotting or  bleeding from your vagina.  You have severe abdominal cramping or [name].  You have rapid weight gain or loss.  You have shortness of breath with chest [name].  You notice sudden or extreme swelling of your face, hands, ankles, feet, or legs.  You  have not felt your baby move in over an hour.  You have severe headaches that do not go away with medicine.  You have vision changes. Document Released: 07/14/2001 Document Revised: 07/25/2013 Document Reviewed: 09/20/2012 [facility] Patient Information 2015 [place], [place]. This information is not intended to replace advice given to you by your health care provider. Make sure you discuss any questions you have with your health care provider.

## 2020-05-10 LAB — INTEGRATED 2
AFP MoM: 1.75
Alpha-Fetoprotein: 72 ng/mL
Crown Rump Length: 67.6 mm
DIA MoM: 0.96
DIA Value: 170 pg/mL
Estriol, Unconjugated: 3.02 ng/mL
Gest. Age on Collection Date: 12.9 weeks
Gestational Age: 21.7 weeks
Maternal Age at EDD: 34.9 yr
Nuchal Translucency (NT): 1.9 mm
Nuchal Translucency MoM: 1.25
Number of Fetuses: 1
PAPP-A MoM: 0.49
PAPP-A Value: 288.8 ng/mL
Test Results:: NEGATIVE
Weight: 256 [lb_av]
Weight: 265 [lb_av]
hCG MoM: 3.38
hCG Value: 46.6 IU/mL
uE3 MoM: 1.12

## 2020-05-10 LAB — CYTOLOGY - PAP
Chlamydia: NEGATIVE
Comment: NEGATIVE
Comment: NEGATIVE
Comment: NORMAL
Diagnosis: NEGATIVE
High risk HPV: NEGATIVE
Neisseria Gonorrhea: NEGATIVE

## 2020-05-10 LAB — URINE CULTURE

## 2020-05-29 ENCOUNTER — Encounter: Payer: 59 | Admitting: Women's Health

## 2020-05-29 ENCOUNTER — Other Ambulatory Visit: Payer: 59

## 2020-05-29 DIAGNOSIS — Z3480 Encounter for supervision of other normal pregnancy, unspecified trimester: Secondary | ICD-10-CM | POA: Diagnosis not present

## 2020-05-29 DIAGNOSIS — Z3A25 25 weeks gestation of pregnancy: Secondary | ICD-10-CM | POA: Diagnosis not present

## 2020-05-29 DIAGNOSIS — Z131 Encounter for screening for diabetes mellitus: Secondary | ICD-10-CM | POA: Diagnosis not present

## 2020-06-03 ENCOUNTER — Encounter: Payer: Self-pay | Admitting: Advanced Practice Midwife

## 2020-06-03 ENCOUNTER — Ambulatory Visit (INDEPENDENT_AMBULATORY_CARE_PROVIDER_SITE_OTHER): Payer: 59 | Admitting: Advanced Practice Midwife

## 2020-06-03 VITALS — BP 116/70 | HR 77 | Wt 268.5 lb

## 2020-06-03 DIAGNOSIS — Z3480 Encounter for supervision of other normal pregnancy, unspecified trimester: Secondary | ICD-10-CM | POA: Diagnosis not present

## 2020-06-03 DIAGNOSIS — Z331 Pregnant state, incidental: Secondary | ICD-10-CM

## 2020-06-03 DIAGNOSIS — Z1389 Encounter for screening for other disorder: Secondary | ICD-10-CM

## 2020-06-03 DIAGNOSIS — Z23 Encounter for immunization: Secondary | ICD-10-CM

## 2020-06-03 DIAGNOSIS — Z3A25 25 weeks gestation of pregnancy: Secondary | ICD-10-CM

## 2020-06-03 LAB — POCT URINALYSIS DIPSTICK OB
Blood, UA: NEGATIVE
Glucose, UA: NEGATIVE
Ketones, UA: NEGATIVE
Leukocytes, UA: NEGATIVE
Nitrite, UA: NEGATIVE
POC,PROTEIN,UA: NEGATIVE

## 2020-06-03 NOTE — Progress Notes (Signed)
   LOW-RISK PREGNANCY VISIT Patient name: Rebecca [name] MRN [id number]  Date of birth: 07/09/1986 Chief Complaint:   Routine Prenatal Visit  History of Present Illness:   Rebecca [name] is a 34 y.o. G52P1001 female at [redacted]w[redacted]d with an Estimated Date of Delivery: 09/10/20 being seen today for ongoing management of a low-risk pregnancy.  [name]. Contractions: Not present. Vag. Bleeding: None.  Movement: Present. denies leaking of fluid. Review of Systems:   Pertinent items are noted in HPI Denies abnormal vaginal discharge w/ itching/odor/irritation, headaches, visual changes, shortness of breath, chest pain, abdominal pain, severe nausea/vomiting, or problems with urination or bowel movements unless otherwise stated above. Pertinent History Reviewed:  Reviewed past medical,surgical, social, obstetrical and family history.  Reviewed problem list, medications and allergies. Physical Assessment:   Vitals:   06/03/20 1006  BP: 116/70  Pulse: 77  Weight: 268 lb 8 oz (121.8 kg)  Body mass index is 36.42 kg/m.        Physical Examination:   General appearance: Well appearing, and in no distress  Mental status: Alert, oriented to person, place, and time  Skin: Warm & dry  Cardiovascular: Normal heart rate noted  Respiratory: Normal respiratory effort, no distress  Abdomen: Soft, gravid, nontender  Pelvic: Cervical exam deferred         Extremities: Edema: None  Fetal Status:     Movement: Present    Chaperone: n/a    Results for orders placed or performed in visit on 06/03/20 (from the past 24 hour(s))  POC Urinalysis Dipstick OB   Collection Time: 06/03/20 10:07 AM  Result Value Ref Range   Color, UA     Clarity, UA     Glucose, UA Negative Negative   Bilirubin, UA     Ketones, UA neg    Spec Grav, UA     Blood, UA neg    pH, UA     POC,PROTEIN,UA Negative Negative, Trace, Small (1+), Moderate (2+), Large (3+), 4+   Urobilinogen, UA     Nitrite, UA neg     Leukocytes, UA Negative Negative   Appearance     Odor      Assessment & Plan:  1) Low-risk pregnancy G2P1001 at [redacted]w[redacted]d with an Estimated Date of Delivery: 09/10/20    Meds: No orders of the defined types were placed in this encounter.  Labs/procedures today: Tdap  Plan:  Continue routine obstetrical care  Next visit: prefers in person    Reviewed: Term labor symptoms and general obstetric precautions including but not limited to vaginal bleeding, contractions, leaking of fluid and fetal movement were reviewed in detail with the patient.  All questions were answered.   Follow-up: Return in about 3 weeks (around 06/24/2020).  Orders Placed This Encounter  Procedures  . POC Urinalysis Dipstick OB   Jacklyn Shell DNP, CNM 06/03/2020 10:20 AM

## 2020-06-03 NOTE — Patient Instructions (Signed)
Rebecca [name], I greatly value your feedback.  If you receive a survey following your visit with us today, we appreciate you taking the time to fill it out.  Thanks, Fran Cresenzo-Dishmon, DNP, CNM  [facility] is now [facility] ([address] [place], [place] [address]) Entrance located off of [address] Free 24/7 valet parking   Go to [email or web address] to register for FREE online childbirth classes    Call the office [phone]) or go to [facility] if:  You begin to have strong, frequent contractions  Your water breaks.  Sometimes it is a big gush of fluid, sometimes it is just a trickle that keeps getting your panties wet or running down your legs  You have vaginal bleeding.  It is normal to have a small amount of spotting if your cervix was checked.   You don't feel your baby moving like normal.  If you don't, get you something to eat and drink and lay down and focus on feeling your baby move.  You should feel at least 10 movements in 2 hours.  If you don't, you should call the office or go to [facility].   Home Blood Pressure Monitoring for Patients   Your provider has recommended that you check your blood pressure (BP) at least once a week at home. If you do not have a blood pressure cuff at home, one will be provided for you. Contact your provider if you have not received your monitor within 1 week.   Helpful Tips for Accurate Home Blood Pressure Checks  . Don't smoke, exercise, or drink caffeine 30 minutes before checking your BP . Use the restroom before checking your BP (a full bladder can raise your pressure) . Relax in a comfortable upright chair . Feet on the ground . Left arm resting comfortably on a flat surface at the level of your heart . Legs uncrossed . Back supported . Sit quietly and don't talk . Place the cuff on your bare arm . Adjust snuggly, so that only two fingertips can fit  between your skin and the top of the cuff . Check 2 readings separated by at least one minute . Keep a log of your BP readings . For a visual, please reference this diagram: http://ccnc.care/bpdiagram  Provider Name: [facility]     Phone: [phone]  Zone 1: ALL CLEAR  Continue to monitor your symptoms:  . BP reading is less than 140 (top number) or less than 90 (bottom number)  . No right upper stomach pain . No headaches or seeing spots . No feeling nauseated or throwing up . No swelling in face and hands  Zone 2: CAUTION Call your doctor's office for any of the following:  . BP reading is greater than 140 (top number) or greater than 90 (bottom number)  . Stomach pain under your ribs in the middle or right side . Headaches or seeing spots . Feeling nauseated or throwing up . Swelling in face and hands  Zone 3: EMERGENCY  Seek immediate medical care if you have any of the following:  . BP reading is greater than160 (top number) or greater than 110 (bottom number) . Severe headaches not improving with Tylenol . Serious difficulty catching your breath . Any worsening symptoms from Zone 2      

## 2020-06-04 LAB — GLUCOSE TOLERANCE, 2 HOURS W/ 1HR
Glucose, 1 hour: 88 mg/dL (ref 65–179)
Glucose, Fasting: 85 mg/dL (ref 65–91)

## 2020-06-04 LAB — CBC
Hematocrit: 36.3 % (ref 34.0–46.6)
Hemoglobin: 11.8 g/dL (ref 11.1–15.9)
MCH: 28.3 pg (ref 26.6–33.0)
MCHC: 32.5 g/dL (ref 31.5–35.7)
MCV: 87 fL (ref 79–97)
Platelets: 224 10*3/uL (ref 150–450)
RBC: 4.17 x10E6/uL (ref 3.77–5.28)
RDW: 13.5 % (ref 11.7–15.4)
WBC: 6 10*3/uL (ref 3.4–10.8)

## 2020-06-04 LAB — RPR: RPR Ser Ql: NONREACTIVE

## 2020-06-04 LAB — HIV ANTIBODY (ROUTINE TESTING W REFLEX): HIV Screen 4th Generation wRfx: NONREACTIVE

## 2020-06-04 LAB — ANTIBODY SCREEN: Antibody Screen: NEGATIVE

## 2020-06-24 ENCOUNTER — Other Ambulatory Visit: Payer: Self-pay

## 2020-06-24 ENCOUNTER — Ambulatory Visit (INDEPENDENT_AMBULATORY_CARE_PROVIDER_SITE_OTHER): Payer: 59 | Admitting: Advanced Practice Midwife

## 2020-06-24 ENCOUNTER — Encounter: Payer: Self-pay | Admitting: Advanced Practice Midwife

## 2020-06-24 VITALS — BP 123/72 | HR 76 | Wt 271.0 lb

## 2020-06-24 DIAGNOSIS — Z3A28 28 weeks gestation of pregnancy: Secondary | ICD-10-CM

## 2020-06-24 DIAGNOSIS — Z331 Pregnant state, incidental: Secondary | ICD-10-CM

## 2020-06-24 DIAGNOSIS — Z9884 Bariatric surgery status: Secondary | ICD-10-CM

## 2020-06-24 DIAGNOSIS — Z1389 Encounter for screening for other disorder: Secondary | ICD-10-CM

## 2020-06-24 DIAGNOSIS — Z6833 Body mass index (BMI) 33.0-33.9, adult: Secondary | ICD-10-CM

## 2020-06-24 DIAGNOSIS — Z3480 Encounter for supervision of other normal pregnancy, unspecified trimester: Secondary | ICD-10-CM

## 2020-06-24 LAB — POCT URINALYSIS DIPSTICK OB
Blood, UA: NEGATIVE
Glucose, UA: NEGATIVE
Ketones, UA: NEGATIVE
Leukocytes, UA: NEGATIVE
Nitrite, UA: NEGATIVE
POC,PROTEIN,UA: NEGATIVE

## 2020-06-24 NOTE — Progress Notes (Signed)
   LOW-RISK PREGNANCY VISIT Patient name: Rebecca [name] MRN [id number]  Date of birth: [birth year] Chief Complaint:   Routine Prenatal Visit  History of Present Illness:   Rebecca [name] is a 34 y.o. G35P1001 female at [redacted]w[redacted]d with an Estimated Date of Delivery: 09/10/20 being seen today for ongoing management of a low-risk pregnancy.  [name]. Contractions: Not present. Vag. Bleeding: None.  Movement: Present. denies leaking of fluid. Review of Systems:   Pertinent items are noted in HPI Denies abnormal vaginal discharge w/ itching/odor/irritation, headaches, visual changes, shortness of breath, chest pain, abdominal pain, severe nausea/vomiting, or problems with urination or bowel movements unless otherwise stated above. Pertinent History Reviewed:  Reviewed past medical,surgical, social, obstetrical and family history.  Reviewed problem list, medications and allergies. Physical Assessment:   Vitals:   06/24/20 1403  BP: 123/72  Pulse: 76  Weight: 271 lb (122.9 kg)  Body mass index is 36.75 kg/m.        Physical Examination:   General appearance: Well appearing, and in no distress  Mental status: Alert, oriented to person, place, and time  Skin: Warm & dry  Cardiovascular: Normal heart rate noted  Respiratory: Normal respiratory effort, no distress  Abdomen: Soft, gravid, nontender  Pelvic: Cervical exam deferred         Extremities: Edema: None  Fetal Status:     Movement: Present    Chaperone: n/a    No results found for this or any previous visit (from the past 24 hour(s)).  Assessment & Plan:  1) Low-risk pregnancy G2P1001 at [redacted]w[redacted]d with an Estimated Date of Delivery: 09/10/20   2) Obesity , Check EFW next visit   Meds: No orders of the defined types were placed in this encounter.  Labs/procedures today:   Plan:  Continue routine obstetrical care  Next visit: prefers in person    Reviewed: Preterm labor symptoms and general obstetric precautions  including but not limited to vaginal bleeding, contractions, leaking of fluid and fetal movement were reviewed in detail with the patient.  All questions were answered. Has home bp cuff. Check bp weekly, let us know if >140/90.   Follow-up: Return in about 3 weeks (around 07/15/2020) for LROB, US:EFW.  Orders Placed This Encounter  Procedures  . US OB Follow Up  . POC Urinalysis Dipstick OB   Jacklyn Shell DNP, CNM 06/24/2020 2:19 PM

## 2020-06-24 NOTE — Patient Instructions (Addendum)
Rebecca [name], I greatly value your feedback.  If you receive a survey following your visit with [place] today, we appreciate you taking the time to fill it out.  Thanks, [name], CNM   [facility] HAS MOVED!!! It is now [facility] at [facility] ([address] [place], [place] [address]) Entrance located off of [address] Free 24/7 valet parking   Go to [email or web address] to register for FREE online childbirth classes    Call the office [phone]) or go to [facility] if:  You begin to have strong, frequent contractions  Your water breaks.  Sometimes it is a big gush of fluid, sometimes it is just a trickle that keeps getting your panties wet or running down your legs  You have vaginal bleeding.  It is normal to have a small amount of spotting if your cervix was checked.   You don't feel your baby moving like normal.  If you don't, get you something to eat and drink and lay down and focus on feeling your baby move.  You should feel at least 10 movements in 2 hours.  If you don't, you should call the office or go to [facility].    Tdap Vaccine  It is recommended that you get the Tdap vaccine during the third trimester of EACH pregnancy to help protect your baby from getting pertussis (whooping cough)  27-36 weeks is the BEST time to do this so that you can pass the protection on to your baby. During pregnancy is better than after pregnancy, but if you are unable to get it during pregnancy it will be offered at the hospital.   You will be offered this vaccine in the office after 27 weeks. If you do not have health insurance, you can get this vaccine at the health department or your family doctor  Everyone who will be around your baby should also be up-to-date on their vaccines. Adults (who are not pregnant) only need 1 dose of Tdap during adulthood.   Third Trimester of Pregnancy The third trimester is from week 29 through week 42, months 7 through 9. The  third trimester is a time when the fetus is growing rapidly. At the end of the ninth month, the fetus is about 20 inches in length and weighs 6-10 pounds.  BODY CHANGES Your body goes through many changes during pregnancy. The changes vary from woman to woman.   Your weight will continue to increase. You can expect to gain 25-35 pounds (11-16 kg) by the end of the pregnancy.  You may begin to get stretch marks on your hips, abdomen, and breasts.  You may urinate more often because the fetus is moving lower into your pelvis and pressing on your bladder.  You may develop or continue to have heartburn as a result of your pregnancy.  You may develop constipation because certain hormones are causing the muscles that push waste through your intestines to slow down.  You may develop hemorrhoids or swollen, bulging veins (varicose veins).  You may have pelvic [name] because of the weight gain and pregnancy hormones relaxing your joints between the bones in your pelvis. Backaches may result from overexertion of the muscles supporting your posture.  You may have changes in your hair. These can include thickening of your hair, rapid growth, and changes in texture. Some women also have hair loss during or after pregnancy, or hair that feels dry or thin. Your hair will most likely return to  normal after your baby is born.  Your breasts will continue to grow and be tender. A yellow discharge may leak from your breasts called colostrum.  Your belly button may stick out.  You may feel short of breath because of your expanding uterus.  You may notice the fetus "dropping," or moving lower in your abdomen.  You may have a bloody mucus discharge. This usually occurs a few days to a week before labor begins.  Your cervix becomes thin and soft (effaced) near your due date. WHAT TO EXPECT AT YOUR PRENATAL EXAMS  You will have prenatal exams every 2 weeks until week 36. Then, you will have weekly prenatal  exams. During a routine prenatal visit:  You will be weighed to make sure you and the fetus are growing normally.  Your blood pressure is taken.  Your abdomen will be measured to track your baby's growth.  The fetal heartbeat will be listened to.  Any test results from the previous visit will be discussed.  You may have a cervical check near your due date to see if you have effaced. At around 36 weeks, your caregiver will check your cervix. At the same time, your caregiver will also perform a test on the secretions of the vaginal tissue. This test is to determine if a type of bacteria, Group B streptococcus, is present. Your caregiver will explain this further. Your caregiver may ask you:  What your birth plan is.  How you are feeling.  If you are feeling the baby move.  If you have had any abnormal symptoms, such as leaking fluid, bleeding, severe headaches, or abdominal cramping.  If you have any questions. Other tests or screenings that may be performed during your third trimester include:  Blood tests that check for low iron levels (anemia).  Fetal testing to check the health, activity level, and growth of the fetus. Testing is done if you have certain medical conditions or if there are problems during the pregnancy. FALSE LABOR You may feel small, irregular contractions that eventually go away. These are called Braxton Hicks contractions, or false labor. Contractions may last for hours, days, or even weeks before true labor sets in. If contractions come at regular intervals, intensify, or become painful, it is best to be seen by your caregiver.  SIGNS OF LABOR   Menstrual-like cramps.  Contractions that are 5 minutes apart or less.  Contractions that start on the top of the uterus and spread down to the lower abdomen and back.  A sense of increased pelvic pressure or back [name].  A watery or bloody mucus discharge that comes from the vagina. If you have any of these  signs before the 37th week of pregnancy, call your caregiver right away. You need to go to the hospital to get checked immediately. HOME CARE INSTRUCTIONS   Avoid all smoking, herbs, alcohol, and unprescribed drugs. These chemicals affect the formation and growth of the baby.  Follow your caregiver's instructions regarding medicine use. There are medicines that are either safe or unsafe to take during pregnancy.  Exercise only as directed by your caregiver. Experiencing uterine cramps is a good sign to stop exercising.  Continue to eat regular, healthy meals.  Wear a good support bra for breast tenderness.  Do not use hot tubs, steam rooms, or saunas.  Wear your seat belt at all times when driving.  Avoid raw meat, uncooked cheese, cat litter boxes, and soil used by cats. These carry germs that can  cause birth defects in the baby.  Take your prenatal vitamins.  Try taking a stool softener (if your caregiver approves) if you develop constipation. Eat more high-fiber foods, such as fresh vegetables or fruit and whole grains. Drink plenty of fluids to keep your urine clear or pale yellow.  Take warm sitz baths to soothe any [name] or discomfort caused by hemorrhoids. Use hemorrhoid cream if your caregiver approves.  If you develop varicose veins, wear support hose. Elevate your feet for 15 minutes, 3-4 times a day. Limit salt in your diet.  Avoid heavy lifting, wear low heal shoes, and practice good posture.  Rest a lot with your legs elevated if you have leg cramps or low back [name].  Visit your dentist if you have not gone during your pregnancy. Use a soft toothbrush to brush your teeth and be gentle when you floss.  A sexual relationship may be continued unless your caregiver directs you otherwise.  Do not travel far distances unless it is absolutely necessary and only with the approval of your caregiver.  Take prenatal classes to understand, practice, and ask questions about the  labor and delivery.  Make a trial run to the hospital.  Pack your hospital bag.  Prepare the baby's nursery.  Continue to go to all your prenatal visits as directed by your caregiver. SEEK MEDICAL CARE IF:  You are unsure if you are in labor or if your water has broken.  You have dizziness.  You have mild pelvic cramps, pelvic pressure, or nagging [name] in your abdominal area.  You have persistent nausea, vomiting, or diarrhea.  You have a bad smelling vaginal discharge.  You have [name] with urination. SEEK IMMEDIATE MEDICAL CARE IF:   You have a fever.  You are leaking fluid from your vagina.  You have spotting or bleeding from your vagina.  You have severe abdominal cramping or [name].  You have rapid weight loss or gain.  You have shortness of breath with chest [name].  You notice sudden or extreme swelling of your face, hands, ankles, feet, or legs.  You have not felt your baby move in over an hour.  You have severe headaches that do not go away with medicine.  You have vision changes. Document Released: 07/14/2001 Document Revised: 07/25/2013 Document Reviewed: 09/20/2012 [facility] Patient Information 2015 [place], [place]. This information is not intended to replace advice given to you by your health care provider. Make sure you discuss any questions you have with your health care provider.  Sciatica Rehab Ask your health care provider which exercises are safe for you. Do exercises exactly as told by your health care provider and adjust them as directed. It is normal to feel mild stretching, pulling, tightness, or discomfort as you do these exercises, but you should stop right away if you feel sudden [name] or your [name] gets worse.Do not begin these exercises until told by your health care provider. Stretching and range of motion exercises These exercises warm up your muscles and joints and improve the movement and flexibility of your hips and your back. These exercises  also help to relieve [name], numbness, and tingling. Exercise A: Sciatic nerve glide  1. Sit in a chair with your head facing down toward your chest. Place your hands behind your back. Let your shoulders slump forward. 2. Slowly straighten one of your knees while you tilt your head back as if you are looking toward the ceiling. Only straighten your leg as far as you can  without making your symptoms worse. 3. Hold for __________ seconds. 4. Slowly return to the starting position. 5. Repeat with your other leg. Repeat __________ times. Complete this exercise __________ times a day. Exercise B: Knee to chest with hip adduction and internal rotation    1. Lie on your back on a firm surface with both legs straight. 2. Bend one of your knees and move it up toward your chest until you feel a gentle stretch in your lower back and buttock. Then, move your knee toward the shoulder that is on the opposite side from your leg. ? Hold your leg in this position by holding onto the front of your knee. 3. Hold for __________ seconds. 4. Slowly return to the starting position. 5. Repeat with your other leg. Repeat __________ times. Complete this exercise __________ times a day. Exercise C: Prone extension on elbows    1. Lie on your abdomen on a firm surface. A bed may be too soft for this exercise. 2. Prop yourself up on your elbows. 3. Use your arms to help lift your chest up until you feel a gentle stretch in your abdomen and your lower back. ? This will place some of your body weight on your elbows. If this is uncomfortable, try stacking pillows under your chest. ? Your hips should stay down, against the surface that you are lying on. Keep your hip and back muscles relaxed. 4. Hold for __________ seconds. 5. Slowly relax your upper body and return to the starting position. Repeat __________ times. Complete this exercise __________ times a day. Strengthening exercises These exercises build strength  and endurance in your back. Endurance is the ability to use your muscles for a long time, even after they get tired. Exercise D: Pelvic tilt  1. Lie on your back on a firm surface. Bend your knees and keep your feet flat. 2. Tense your abdominal muscles. Tip your pelvis up toward the ceiling and flatten your lower back into the floor. ? To help with this exercise, you may place a small towel under your lower back and try to push your back into the towel. 3. Hold for __________ seconds. 4. Let your muscles relax completely before you repeat this exercise. Repeat __________ times. Complete this exercise __________ times a day. Exercise E: Alternating arm and leg raises    1. Get on your hands and knees on a firm surface. If you are on a hard floor, you may want to use padding to cushion your knees, such as an exercise mat. 2. Line up your arms and legs. Your hands should be below your shoulders, and your knees should be below your hips. 3. Lift your left leg behind you. At the same time, raise your right arm and straighten it in front of you. ? Do not lift your leg higher than your hip. ? Do not lift your arm higher than your shoulder. ? Keep your abdominal and back muscles tight. ? Keep your hips facing the ground. ? Do not arch your back. ? Keep your balance carefully, and do not hold your breath. 4. Hold for __________ seconds. 5. Slowly return to the starting position and repeat with your right leg and your left arm. Repeat __________ times. Complete this exercise __________ times a day. Posture and body mechanics    Body mechanics refers to the movements and positions of your body while you do your daily activities. Posture is part of body mechanics. Good posture and healthy body mechanics  can help to relieve stress in your body's tissues and joints. Good posture means that your spine is in its natural S-curve position (your spine is neutral), your shoulders are pulled back slightly,  and your head is not tipped forward. The following are general guidelines for applying improved posture and body mechanics to your everyday activities. Standing     When standing, keep your spine neutral and your feet about hip-width apart. Keep a slight bend in your knees. Your ears, shoulders, and hips should line up.  When you do a task in which you stand in one place for a long time, place one foot up on a stable object that is 2-4 inches (5-10 cm) high, such as a footstool. This helps keep your spine neutral. Sitting     When sitting, keep your spine neutral and keep your feet flat on the floor. Use a footrest, if necessary, and keep your thighs parallel to the floor. Avoid rounding your shoulders, and avoid tilting your head forward.  When working at a desk or a computer, keep your desk at a height where your hands are slightly lower than your elbows. Slide your chair under your desk so you are close enough to maintain good posture.  When working at a computer, place your monitor at a height where you are looking straight ahead and you do not have to tilt your head forward or downward to look at the screen. Resting     When lying down and resting, avoid positions that are most painful for you.  If you have [name] with activities such as sitting, bending, stooping, or squatting (flexion-based activities), lie in a position in which your body does not bend very much. For example, avoid curling up on your side with your arms and knees near your chest (fetal position).  If you have [name] with activities such as standing for a long time or reaching with your arms (extension-based activities), lie with your spine in a neutral position and bend your knees slightly. Try the following positions: ? Lying on your side with a pillow between your knees. ? Lying on your back with a pillow under your knees. Lifting     When lifting objects, keep your feet at least shoulder-width apart and  tighten your abdominal muscles.  Bend your knees and hips and keep your spine neutral. It is important to lift using the strength of your legs, not your back. Do not lock your knees straight out.  Always ask for help to lift heavy or awkward objects. This information is not intended to replace advice given to you by your health care provider. Make sure you discuss any questions you have with your health care provider. Document Released: 07/20/2005 Document Revised: 03/26/2016 Document Reviewed: 04/05/2015 Elsevier Interactive Patient Education  2017 [employer].

## 2020-07-01 ENCOUNTER — Other Ambulatory Visit: Payer: Self-pay | Admitting: Advanced Practice Midwife

## 2020-07-01 DIAGNOSIS — M5431 Sciatica, right side: Secondary | ICD-10-CM

## 2020-07-01 DIAGNOSIS — Z3480 Encounter for supervision of other normal pregnancy, unspecified trimester: Secondary | ICD-10-CM

## 2020-07-01 MED ORDER — GABAPENTIN 100 MG PO CAPS: 100.0000 mg | ORAL_CAPSULE | Freq: Three times a day (TID) | ORAL | 2 refills | Status: DC

## 2020-07-01 MED FILL — GABAPENTIN 100 MG CAPSULE: 100 | 30 days supply | Qty: 90 | Fill #0

## 2020-07-01 NOTE — Progress Notes (Signed)
Gabapentin 100mg  TID for sciatica pain. Referral to PT, or can see chiropractor

## 2020-07-15 ENCOUNTER — Other Ambulatory Visit: Payer: Self-pay

## 2020-07-15 ENCOUNTER — Ambulatory Visit (INDEPENDENT_AMBULATORY_CARE_PROVIDER_SITE_OTHER): Payer: 59 | Admitting: Advanced Practice Midwife

## 2020-07-15 VITALS — BP 136/87 | HR 81 | Wt 274.0 lb

## 2020-07-15 DIAGNOSIS — Z331 Pregnant state, incidental: Secondary | ICD-10-CM

## 2020-07-15 DIAGNOSIS — Z1389 Encounter for screening for other disorder: Secondary | ICD-10-CM

## 2020-07-15 DIAGNOSIS — Z3A31 31 weeks gestation of pregnancy: Secondary | ICD-10-CM

## 2020-07-15 LAB — POCT URINALYSIS DIPSTICK OB
Blood, UA: NEGATIVE
Glucose, UA: NEGATIVE
Ketones, UA: NEGATIVE
Leukocytes, UA: NEGATIVE
Nitrite, UA: NEGATIVE
POC,PROTEIN,UA: NEGATIVE

## 2020-07-15 NOTE — Patient Instructions (Addendum)
Rebecca [name], I greatly value your feedback.  If you receive a survey following your visit with [place] today, we appreciate you taking the time to fill it out.  Thanks, Rebecca Beams, DNP, CNM  [facility] HAS MOVED!!! It is now [facility] at [facility] ([address] [place], [place] [address]) Entrance located off of [address] Free 24/7 valet parking   Go to [email or web address] to register for FREE online childbirth classes    Call the office [phone]) or go to [facility] if:  You begin to have strong, frequent contractions  Your water breaks.  Sometimes it is a big gush of fluid, sometimes it is just a trickle that keeps getting your panties wet or running down your legs  You have vaginal bleeding.  It is normal to have a small amount of spotting if your cervix was checked.   You don't feel your baby moving like normal.  If you don't, get you something to eat and drink and lay down and focus on feeling your baby move.  You should feel at least 10 movements in 2 hours.  If you don't, you should call the office or go to [facility].   Home Blood Pressure Monitoring for Patients   Your provider has recommended that you check your blood pressure (BP) at least once a week at home. If you do not have a blood pressure cuff at home, one will be provided for you. Contact your provider if you have not received your monitor within 1 week.   Helpful Tips for Accurate Home Blood Pressure Checks  . Don't smoke, exercise, or drink caffeine 30 minutes before checking your BP . Use the restroom before checking your BP (a full bladder can raise your pressure) . Relax in a comfortable upright chair . Feet on the ground . Left arm resting comfortably on a flat surface at the level of your heart . Legs uncrossed . Back supported . Sit quietly and don't talk . Place the cuff on your bare arm . Adjust snuggly, so that only two fingertips can fit  between your skin and the top of the cuff . Check 2 readings separated by at least one minute . Keep a log of your BP readings . For a visual, please reference this diagram: http://ccnc.care/bpdiagram  Provider Name: [facility]     Phone: [phone]  Zone 1: ALL CLEAR  Continue to monitor your symptoms:  . BP reading is less than 140 (top number) or less than 90 (bottom number)  . No right upper stomach [name] . No headaches or seeing spots . No feeling nauseated or throwing up . No swelling in face and hands  Zone 2: CAUTION Call your doctor's office for any of the following:  . BP reading is greater than 140 (top number) or greater than 90 (bottom number)  . Stomach [name] under your ribs in the middle or right side . Headaches or seeing spots . Feeling nauseated or throwing up . Swelling in face and hands  Zone 3: EMERGENCY  Seek immediate medical care if you have any of the following:  . BP reading is greater than160 (top number) or greater than 110 (bottom number) . Severe headaches not improving with Tylenol . Serious difficulty catching your breath . Any worsening symptoms from Zone 2

## 2020-07-15 NOTE — Progress Notes (Signed)
   LOW-RISK PREGNANCY VISIT Patient name: Rebecca [name] MRN [id number]  Date of birth: Jul 24, 1986 Chief Complaint:   Routine Prenatal Visit  History of Present Illness:   Rebecca [name] is a 34 y.o. G78P1001 female at [redacted]w[redacted]d with an Estimated Date of Delivery: 09/10/20 being seen today for ongoing management of a low-risk pregnancy.  Today she reports feeling so much better w/sciatica.  Takes gabepentin prn, but what really helped was a massage. Cancelled PT. Contractions: Irregular. Vag. Bleeding: None.  Movement: Present. denies leaking of fluid. Review of Systems:   Pertinent items are noted in HPI Denies abnormal vaginal discharge w/ itching/odor/irritation, headaches, visual changes, shortness of breath, chest pain, abdominal pain, severe nausea/vomiting, or problems with urination or bowel movements unless otherwise stated above. Pertinent History Reviewed:  Reviewed past medical,surgical, social, obstetrical and family history.  Reviewed problem list, medications and allergies. Physical Assessment:   Vitals:   07/15/20 1453  BP: 136/87  Pulse: 81  Weight: 274 lb (124.3 kg)  Body mass index is 37.16 kg/m.        Physical Examination:   General appearance: Well appearing, and in no distress  Mental status: Alert, oriented to person, place, and time  Skin: Warm & dry  Cardiovascular: Normal heart rate noted  Respiratory: Normal respiratory effort, no distress  Abdomen: Soft, gravid, nontender  Pelvic: Cervical exam deferred         Extremities: Edema: None  Fetal Status: Fetal Heart Rate (bpm): 140 Fundal Height: 32 cm Movement: Present    Chaperone: n/a    Results for orders placed or performed in visit on 07/15/20 (from the past 24 hour(s))  POC Urinalysis Dipstick OB   Collection Time: 07/15/20  2:51 PM  Result Value Ref Range   Color, UA     Clarity, UA     Glucose, UA Negative Negative   Bilirubin, UA     Ketones, UA neg    Spec Grav, UA     Blood, UA neg    pH, UA      POC,PROTEIN,UA Negative Negative, Trace, Small (1+), Moderate (2+), Large (3+), 4+   Urobilinogen, UA     Nitrite, UA neg    Leukocytes, UA Negative Negative   Appearance     Odor      Assessment & Plan:  1) Low-risk pregnancy G2P1001 at [redacted]w[redacted]d with an Estimated Date of Delivery: 09/10/20   2) Sciatica, improved! After massage   Meds: No orders of the defined types were placed in this encounter.  Labs/procedures today: none  Plan:  Continue routine obstetrical care  Next visit: prefers in person    Reviewed: Preterm labor symptoms and general obstetric precautions including but not limited to vaginal bleeding, contractions, leaking of fluid and fetal movement were reviewed in detail with the patient.  All questions were answered. Has home bp cuff.. Check bp weekly, let us know if >140/90.   Follow-up: Return in about 2 weeks (around 07/29/2020) for LROB.  Orders Placed This Encounter  Procedures  . POC Urinalysis Dipstick OB   Jacklyn Shell DNP, CNM 07/15/2020 3:07 PM

## 2020-07-16 DIAGNOSIS — Z029 Encounter for administrative examinations, unspecified: Secondary | ICD-10-CM

## 2020-07-17 ENCOUNTER — Encounter: Payer: Self-pay | Admitting: *Deleted

## 2020-07-29 ENCOUNTER — Ambulatory Visit (INDEPENDENT_AMBULATORY_CARE_PROVIDER_SITE_OTHER): Payer: 59 | Admitting: Obstetrics & Gynecology

## 2020-07-29 ENCOUNTER — Other Ambulatory Visit: Payer: Self-pay

## 2020-07-29 ENCOUNTER — Encounter: Payer: Self-pay | Admitting: Obstetrics & Gynecology

## 2020-07-29 VITALS — BP 114/72 | HR 83 | Temp 97.9°F | Wt 272.5 lb

## 2020-07-29 DIAGNOSIS — Z3480 Encounter for supervision of other normal pregnancy, unspecified trimester: Secondary | ICD-10-CM

## 2020-07-29 DIAGNOSIS — Z331 Pregnant state, incidental: Secondary | ICD-10-CM

## 2020-07-29 DIAGNOSIS — Z1389 Encounter for screening for other disorder: Secondary | ICD-10-CM

## 2020-07-29 DIAGNOSIS — Z9884 Bariatric surgery status: Secondary | ICD-10-CM

## 2020-07-29 DIAGNOSIS — Z3A33 33 weeks gestation of pregnancy: Secondary | ICD-10-CM

## 2020-07-29 LAB — POCT URINALYSIS DIPSTICK OB
Glucose, UA: NEGATIVE
Ketones, UA: NEGATIVE
Nitrite, UA: NEGATIVE
POC,PROTEIN,UA: NEGATIVE

## 2020-07-29 NOTE — Progress Notes (Signed)
LOW-RISK PREGNANCY VISIT Patient name: Rebecca [name] MRN [id number]  Date of birth: 03-31-1986 Chief Complaint:   Routine Prenatal Visit (? Sinus symptoms)  History of Present Illness:   Rebecca [name] is a 34 y.o. G26P1001 female at [redacted]w[redacted]d with an Estimated Date of Delivery: 09/10/20 being seen today for ongoing management of a low-risk pregnancy.  Depression screen [facility] 2/9 03/07/2020 01/04/2020 02/07/2019 05/03/2017 03/29/2017  Decreased Interest 0 0 0 0 0  Down, Depressed, Hopeless 0 0 0 0 0  PHQ - 2 Score 0 0 0 0 0  Altered sleeping 0 - 0 0 -  Tired, decreased energy 0 - 0 1 -  Change in appetite 0 - 0 0 -  Feeling bad or failure about yourself  0 - 0 0 -  Trouble concentrating 0 - 0 0 -  Moving slowly or fidgety/restless 0 - 0 0 -  Suicidal thoughts 0 - 0 0 -  PHQ-9 Score 0 - 0 1 -  Difficult doing work/chores - - Not difficult at all Not difficult at all -    [name]. Contractions: Irregular. Vag. Bleeding: None.  Movement: Present. denies leaking of fluid. Review of Systems:   Pertinent items are noted in HPI Denies abnormal vaginal discharge w/ itching/odor/irritation, headaches, visual changes, shortness of breath, chest pain, abdominal pain, severe nausea/vomiting, or problems with urination or bowel movements unless otherwise stated above. Pertinent History Reviewed:  Reviewed past medical,surgical, social, obstetrical and family history.  Reviewed problem list, medications and allergies. Physical Assessment:   Vitals:   07/29/20 1502  BP: 114/72  Pulse: 83  Temp: 97.9 F (36.6 C)  Weight: 272 lb 8 oz (123.6 kg)  Body mass index is 36.96 kg/m.        Physical Examination:   General appearance: Well appearing, and in no distress  Mental status: Alert, oriented to person, place, and time  Skin: Warm & dry  Cardiovascular: Normal heart rate noted  Respiratory: Normal respiratory effort, no distress  Abdomen: Soft, gravid, nontender  Pelvic: Cervical  exam deferred         Extremities: Edema: None  Fetal Status: Fetal Heart Rate (bpm): 131 Fundal Height: 34 cm Movement: Present    Chaperone: n/a    Results for orders placed or performed in visit on 07/29/20 (from the past 24 hour(s))  POC Urinalysis Dipstick OB   Collection Time: 07/29/20  3:04 PM  Result Value Ref Range   Color, UA     Clarity, UA     Glucose, UA Negative Negative   Bilirubin, UA     Ketones, UA neg    Spec Grav, UA     Blood, UA trace    pH, UA     POC,PROTEIN,UA Negative Negative, Trace, Small (1+), Moderate (2+), Large (3+), 4+   Urobilinogen, UA     Nitrite, UA neg    Leukocytes, UA Moderate (2+) (A) Negative   Appearance     Odor      Assessment & Plan:  1) Low-risk pregnancy G2P1001 at [redacted]w[redacted]d with an Estimated Date of Delivery: 09/10/20   2) Hx of GHTN with normal BP thus far, on ASA 162 mg,    Meds: No orders of the defined types were placed in this encounter.  Labs/procedures today:   Plan:  Continue routine obstetrical care  Next visit: prefers in person    Reviewed: Preterm labor symptoms and general obstetric precautions including but not limited to  vaginal bleeding, contractions, leaking of fluid and fetal movement were reviewed in detail with the patient.  All questions were answered. Has home bp cuff. Rx faxed to . Check bp weekly, let us know if >140/90.   Follow-up: Return in about 2 weeks (around 08/12/2020) for LROB.  Orders Placed This Encounter  Procedures  . POC Urinalysis Dipstick OB    Lazaro Arms, MD 07/29/2020 3:13 PM

## 2020-07-29 NOTE — Addendum Note (Signed)
Addended by: Lazaro Arms on: 07/29/2020 03:18 PM   Modules accepted: Orders

## 2020-08-03 NOTE — L&D Delivery Note (Signed)
Delivery Note Pt pushed x 10 minutes. Variables w/ moderate variability during pushing. At 3:50 PM a viable and healthy female was delivered via Vaginal, Spontaneous (Presentation: Right Occiput Anterior).  APGAR: 8, 9; weight pending. Baby placed skin-to-skin w/ mom. Delayed cord clamping x 2 minutes. Cord clamed x 2 and cut by Dad.  Placenta status: Spontaneous, Intact.  Cord: 3 vessels with the following complications: Long;Hyperspiraled, marginal cord insertion.  Cord pH: NA.   Anesthesia: Epidural Episiotomy: None Lacerations: 2nd degree Suture Repair: 3.0 vicryl rapide Est. Blood Loss (mL): 225  Mom to postpartum.  Baby to Couplet care / Skin to Skin. Placenta to: L&D Feeding: Breast Circ: NA Contraception: Undecided. Declines BLT. FOB considering Vasectomy. Discussed need for bridge contraception.    Dorathy Kinsman 09/01/2020, 4:51 PM

## 2020-08-13 ENCOUNTER — Other Ambulatory Visit: Payer: Self-pay

## 2020-08-13 ENCOUNTER — Ambulatory Visit (INDEPENDENT_AMBULATORY_CARE_PROVIDER_SITE_OTHER): Payer: 59 | Admitting: Obstetrics & Gynecology

## 2020-08-13 ENCOUNTER — Ambulatory Visit (INDEPENDENT_AMBULATORY_CARE_PROVIDER_SITE_OTHER): Payer: 59

## 2020-08-13 ENCOUNTER — Other Ambulatory Visit (HOSPITAL_COMMUNITY)
Admission: RE | Admit: 2020-08-13 | Discharge: 2020-08-13 | Disposition: A | Discharge status: Home / Self Care | Payer: 59 | Source: Ambulatory Visit | Attending: Obstetrics & Gynecology | Admitting: Obstetrics & Gynecology

## 2020-08-13 VITALS — BP 127/86 | HR 67 | Wt 274.0 lb

## 2020-08-13 DIAGNOSIS — Z3483 Encounter for supervision of other normal pregnancy, third trimester: Secondary | ICD-10-CM

## 2020-08-13 DIAGNOSIS — Z3A36 36 weeks gestation of pregnancy: Secondary | ICD-10-CM

## 2020-08-13 DIAGNOSIS — Z3400 Encounter for supervision of normal first pregnancy, unspecified trimester: Secondary | ICD-10-CM

## 2020-08-13 DIAGNOSIS — Z331 Pregnant state, incidental: Secondary | ICD-10-CM

## 2020-08-13 DIAGNOSIS — Z1389 Encounter for screening for other disorder: Secondary | ICD-10-CM

## 2020-08-13 DIAGNOSIS — Z9884 Bariatric surgery status: Secondary | ICD-10-CM

## 2020-08-13 LAB — POCT URINALYSIS DIPSTICK OB
Blood, UA: NEGATIVE
Glucose, UA: NEGATIVE
Ketones, UA: NEGATIVE
Leukocytes, UA: NEGATIVE
Nitrite, UA: NEGATIVE
POC,PROTEIN,UA: NEGATIVE

## 2020-08-13 NOTE — Progress Notes (Signed)
LOW-RISK PREGNANCY VISIT Patient name: Rebecca [name] MRN [id number]  Date of birth: May 25, 1986 Chief Complaint:   Routine Prenatal Visit  History of Present Illness:   Rebecca [name] is a 35 y.o. G51P1001 female at [redacted]w[redacted]d with an Estimated Date of Delivery: 09/10/20 being seen today for ongoing management of a low-risk pregnancy.  Depression screen [facility] 2/9 03/07/2020 01/04/2020 02/07/2019 05/03/2017 03/29/2017  Decreased Interest 0 0 0 0 0  Down, Depressed, Hopeless 0 0 0 0 0  PHQ - 2 Score 0 0 0 0 0  Altered sleeping 0 - 0 0 -  Tired, decreased energy 0 - 0 1 -  Change in appetite 0 - 0 0 -  Feeling bad or failure about yourself  0 - 0 0 -  Trouble concentrating 0 - 0 0 -  Moving slowly or fidgety/restless 0 - 0 0 -  Suicidal thoughts 0 - 0 0 -  PHQ-9 Score 0 - 0 1 -  Difficult doing work/chores - - Not difficult at all Not difficult at all -    [name]. Contractions: Irregular. Vag. Bleeding: None.  Movement: Present. denies leaking of fluid. Review of Systems:   Pertinent items are noted in HPI Denies abnormal vaginal discharge w/ itching/odor/irritation, headaches, visual changes, shortness of breath, chest pain, abdominal pain, severe nausea/vomiting, or problems with urination or bowel movements unless otherwise stated above. Pertinent History Reviewed:  Reviewed past medical,surgical, social, obstetrical and family history.  Reviewed problem list, medications and allergies. Physical Assessment:   Vitals:   08/13/20 1119 08/13/20 1124  BP: (!) 141/86 127/86  Pulse: 62 67  Weight: 274 lb (124.3 kg)   Body mass index is 37.16 kg/m.        Physical Examination:   General appearance: Well appearing, and in no distress  Mental status: Alert, oriented to person, place, and time  Skin: Warm & dry  Cardiovascular: Normal heart rate noted  Respiratory: Normal respiratory effort, no distress  Abdomen: Soft, gravid, nontender  Pelvic: Cervical exam performed   Dilation: 1 Effacement (%): 50 Station: -3  Extremities: Edema: None  Fetal Status: Fetal Heart Rate (bpm): 144 Fundal Height: 36 cm Movement: Present Presentation: Vertex  Chaperone: N/A   Results for orders placed or performed in visit on 08/13/20 (from the past 24 hour(s))  POC Urinalysis Dipstick OB   Collection Time: 08/13/20 11:23 AM  Result Value Ref Range   Color, UA     Clarity, UA     Glucose, UA Negative Negative   Bilirubin, UA     Ketones, UA neg    Spec Grav, UA     Blood, UA neg    pH, UA     POC,PROTEIN,UA Negative Negative, Trace, Small (1+), Moderate (2+), Large (3+), 4+   Urobilinogen, UA     Nitrite, UA neg    Leukocytes, UA Negative Negative   Appearance     Odor      Assessment & Plan:  1) Low-risk pregnancy G2P1001 at [redacted]w[redacted]d with an Estimated Date of Delivery: 09/10/20   2) BP creeping up just a bit, continue ASA 162, BP at home 130s/80s none higher   Meds: No orders of the defined types were placed in this encounter.  Labs/procedures today:   Plan:  Continue routine obstetrical care  Next visit: prefers in person    Reviewed: Preterm labor symptoms and general obstetric precautions including but not limited to vaginal bleeding, contractions, leaking of fluid  and fetal movement were reviewed in detail with the patient.  All questions were answered. Has home bp cuff. Rx faxed to . Check bp weekly, let us know if >140/90.   Follow-up: Return in about 1 week (around 08/20/2020) for LROB.  No future appointments.  Orders Placed This Encounter  Procedures  . Culture, beta strep (group b only)  . POC Urinalysis Dipstick OB   Lazaro Arms  08/13/2020 11:32 AM

## 2020-08-13 NOTE — Progress Notes (Signed)
[place] 36 wks,cephalic,cx length 4.5 cm,afi 33.3 cm,posterior placenta gr 1,fhr 144 bpm,EFW 2840 g 53%,BPD 6.3%,HC 6.7%

## 2020-08-14 DIAGNOSIS — Z3483 Encounter for supervision of other normal pregnancy, third trimester: Secondary | ICD-10-CM | POA: Diagnosis not present

## 2020-08-14 DIAGNOSIS — Z3482 Encounter for supervision of other normal pregnancy, second trimester: Secondary | ICD-10-CM | POA: Diagnosis not present

## 2020-08-15 LAB — CERVICOVAGINAL ANCILLARY ONLY
Chlamydia: NEGATIVE
Comment: NEGATIVE
Comment: NORMAL
Neisseria Gonorrhea: NEGATIVE

## 2020-08-17 LAB — CULTURE, BETA STREP (GROUP B ONLY): Strep Gp B Culture: NEGATIVE

## 2020-08-19 ENCOUNTER — Encounter: Payer: 59 | Admitting: Family Medicine

## 2020-08-20 ENCOUNTER — Other Ambulatory Visit: Payer: Self-pay

## 2020-08-20 ENCOUNTER — Ambulatory Visit (INDEPENDENT_AMBULATORY_CARE_PROVIDER_SITE_OTHER): Payer: 59 | Admitting: Women's Health

## 2020-08-20 ENCOUNTER — Encounter: Payer: Self-pay | Admitting: Women's Health

## 2020-08-20 VITALS — BP 131/83 | HR 67 | Wt 272.0 lb

## 2020-08-20 DIAGNOSIS — O321XX0 Maternal care for breech presentation, not applicable or unspecified: Secondary | ICD-10-CM

## 2020-08-20 DIAGNOSIS — Z3480 Encounter for supervision of other normal pregnancy, unspecified trimester: Secondary | ICD-10-CM

## 2020-08-20 DIAGNOSIS — Z3483 Encounter for supervision of other normal pregnancy, third trimester: Secondary | ICD-10-CM

## 2020-08-20 NOTE — Patient Instructions (Addendum)
[facility]is [facility], I greatly value your feedback.  If you receive a survey following your visit with Rebecca [name] today, we appreciate you taking the time to fill it out.  Thanks, [name], CNM, WHNP-BC  [facility] ([address] [place], [place] [address]) Entrance C, located off of [address] valet parking   Go to [email or web address] to register for FREE online childbirth classes   So your baby is breech? Here are some things you can try to encourage baby to turn head down: . [email or web address].com (different positions to try and more information) . [facility] [phone]) $120 new patients, do not file insurances [facility] [phone]) . Webster's technique- has to be done by a chiropractor certified in the technique . Dive into the deep end of a swimming pool, or do a hand-stand in a swimming pool (the water should be deep enough that your belly is completely covered)     Call the office [phone]) or go to [facility] if:  You begin to have strong, frequent contractions  Your water breaks.  Sometimes it is a big gush of fluid, sometimes it is just a trickle that keeps getting your panties wet or running down your legs  You have vaginal bleeding.  It is normal to have a small amount of spotting if your cervix was checked.   You don't feel your baby moving like normal.  If you don't, get you something to eat and drink and lay down and focus on feeling your baby move.  You should feel at least 10 movements in 2 hours.  If you don't, you should call the office or go to [facility].   Call the office [phone]) or go to [facility] for these signs of pre-eclampsia:  Severe headache that does not go away with Tylenol  Visual changes- seeing spots, double, blurred vision  Pain under your right breast or upper abdomen that does not go away with Tums or heartburn medicine  Nausea and/or  vomiting  Severe swelling in your hands, feet, and face    Home Blood Pressure Monitoring for Patients   Your provider has recommended that you check your blood pressure (BP) at least once a week at home. If you do not have a blood pressure cuff at home, one will be provided for you. Contact your provider if you have not received your monitor within 1 week.   Helpful Tips for Accurate Home Blood Pressure Checks  . Don't smoke, exercise, or drink caffeine 30 minutes before checking your BP . Use the restroom before checking your BP (a full bladder can raise your pressure) . Relax in a comfortable upright chair . Feet on the ground . Left arm resting comfortably on a flat surface at the level of your heart . Legs uncrossed . Back supported . Sit quietly and don't talk . Place the cuff on your bare arm . Adjust snuggly, so that only two fingertips can fit between your skin and the top of the cuff . Check 2 readings separated by at least one minute . Keep a log of your BP readings . For a visual, please reference this diagram: http://ccnc.care/bpdiagram  Provider Name: [facility]     Phone: [phone]  Zone 1: ALL CLEAR  Continue to monitor your symptoms:  . BP reading is less than 140 (top number) or less than 90 (bottom number)  . No right upper stomach pain .  No headaches or seeing spots . No feeling nauseated or throwing up . No swelling in face and hands  Zone 2: CAUTION Call your doctor's office for any of the following:  . BP reading is greater than 140 (top number) or greater than 90 (bottom number)  . Stomach pain under your ribs in the middle or right side . Headaches or seeing spots . Feeling nauseated or throwing up . Swelling in face and hands  Zone 3: EMERGENCY  Seek immediate medical care if you have any of the following:  . BP reading is greater than160 (top number) or greater than 110 (bottom number) . Severe headaches not improving with  Tylenol . Serious difficulty catching your breath . Any worsening symptoms from Zone 2   Braxton Hicks Contractions Contractions of the uterus can occur throughout pregnancy, but they are not always a sign that you are in labor. You may have practice contractions called Braxton Hicks contractions. These false labor contractions are sometimes confused with true labor. What are Deberah Pelton contractions? Braxton Hicks contractions are tightening movements that occur in the muscles of the uterus before labor. Unlike true labor contractions, these contractions do not result in opening (dilation) and thinning of the cervix. Toward the end of pregnancy (32-34 weeks), Braxton Hicks contractions can happen more often and may become stronger. These contractions are sometimes difficult to tell apart from true labor because they can be very uncomfortable. You should not feel embarrassed if you go to the hospital with false labor. Sometimes, the only way to tell if you are in true labor is for your health care provider to look for changes in the cervix. The health care provider will do a physical exam and may monitor your contractions. If you are not in true labor, the exam should show that your cervix is not dilating and your water has not broken. If there are no other health problems associated with your pregnancy, it is completely safe for you to be sent home with false labor. You may continue to have Braxton Hicks contractions until you go into true labor. How to tell the difference between true labor and false labor True labor  Contractions last 30-70 seconds.  Contractions become very regular.  Discomfort is usually felt in the top of the uterus, and it spreads to the lower abdomen and low back.  Contractions do not go away with walking.  Contractions usually become more intense and increase in frequency.  The cervix dilates and gets thinner. False labor  Contractions are usually shorter and not  as strong as true labor contractions.  Contractions are usually irregular.  Contractions are often felt in the front of the lower abdomen and in the groin.  Contractions may go away when you walk around or change positions while lying down.  Contractions get weaker and are shorter-lasting as time goes on.  The cervix usually does not dilate or become thin. Follow these instructions at home:  1. Take over-the-counter and prescription medicines only as told by your health care provider. 2. Keep up with your usual exercises and follow other instructions from your health care provider. 3. Eat and drink lightly if you think you are going into labor. 4. If Braxton Hicks contractions are making you uncomfortable: ? Change your position from lying down or resting to walking, or change from walking to resting. ? Sit and rest in a tub of warm water. ? Drink enough fluid to keep your urine pale yellow. Dehydration may  cause these contractions. ? Do slow and deep breathing several times an hour. 5. Keep all follow-up prenatal visits as told by your health care provider. This is important. Contact a health care provider if:  You have a fever.  You have continuous pain in your abdomen. Get help right away if:  Your contractions become stronger, more regular, and closer together.  You have fluid leaking or gushing from your vagina.  You pass blood-tinged mucus (bloody show).  You have bleeding from your vagina.  You have low back pain that you never had before.  You feel your baby's head pushing down and causing pelvic pressure.  Your baby is not moving inside you as much as it used to. Summary  Contractions that occur before labor are called Braxton Hicks contractions, false labor, or practice contractions.  Braxton Hicks contractions are usually shorter, weaker, farther apart, and less regular than true labor contractions. True labor contractions usually become progressively stronger  and regular, and they become more frequent.  Manage discomfort from [facility] contractions by changing position, resting in a warm bath, drinking plenty of water, or practicing deep breathing. This information is not intended to replace advice given to you by your health care provider. Make sure you discuss any questions you have with your health care provider. Document Revised: 07/02/2017 Document Reviewed: 12/03/2016 Elsevier Patient Education  2020 [employer].

## 2020-08-20 NOTE — Progress Notes (Signed)
LOW-RISK PREGNANCY VISIT Patient name: Rebecca [name] MRN [id number]  Date of birth: 1985-10-26 Chief Complaint:   Routine Prenatal Visit  History of Present Illness:   Rebecca [name] is a 35 y.o. G65P1001 female at [redacted]w[redacted]d with an Estimated Date of Delivery: 09/10/20 being seen today for ongoing management of a low-risk pregnancy.  Depression screen [facility] 2/9 03/07/2020 01/04/2020 02/07/2019 05/03/2017 03/29/2017  Decreased Interest 0 0 0 0 0  Down, Depressed, Hopeless 0 0 0 0 0  PHQ - 2 Score 0 0 0 0 0  Altered sleeping 0 - 0 0 -  Tired, decreased energy 0 - 0 1 -  Change in appetite 0 - 0 0 -  Feeling bad or failure about yourself  0 - 0 0 -  Trouble concentrating 0 - 0 0 -  Moving slowly or fidgety/restless 0 - 0 0 -  Suicidal thoughts 0 - 0 0 -  PHQ-9 Score 0 - 0 1 -  Difficult doing work/chores - - Not difficult at all Not difficult at all -    [name]. Home bp's 120s/70s.  Contractions: Irregular. Vag. Bleeding: None.  Movement: Present. denies leaking of fluid. Review of Systems:   Pertinent items are noted in HPI Denies abnormal vaginal discharge w/ itching/odor/irritation, headaches, visual changes, shortness of breath, chest pain, abdominal pain, severe nausea/vomiting, or problems with urination or bowel movements unless otherwise stated above. Pertinent History Reviewed:  Reviewed past medical,surgical, social, obstetrical and family history.  Reviewed problem list, medications and allergies. Physical Assessment:   Vitals:   08/20/20 1542  BP: 131/83  Pulse: 67  Weight: 272 lb (123.4 kg)  Body mass index is 36.89 kg/m.        Physical Examination:   General appearance: Well appearing, and in no distress  Mental status: Alert, oriented to person, place, and time  Skin: Warm & dry  Cardiovascular: Normal heart rate noted  Respiratory: Normal respiratory effort, no distress  Abdomen: Soft, gravid, nontender  Pelvic: Cervical exam performed  Dilation:  1.5 Effacement (%): 50 Station: Ballotable  Extremities: Edema: None  Fetal Status: Fetal Heart Rate (bpm): 140 Fundal Height: 35 cm Movement: Present Presentation: Complete Breech   Breech w/ head in RUQ, confirmed by informal TA u/s  Chaperone: Faith Rogue   No results found for this or any previous visit (from the past 24 hour(s)).  Assessment & Plan:  1) Low-risk pregnancy G2P1001 at [redacted]w[redacted]d with an Estimated Date of Delivery: 09/10/20   2)  Breech, discussed ECV vs PCS, wants ECV. Placed orders and called L&D to schedule at end of day. Then called pt to notify her of date/time, has changed mind, wants PCS if baby doesn't flip on her own. ECV cancelled. Can try positions on [email or web address], etc to encourage her to turn. F/U w/ [facility] for visit next week  3) H/O GHTN> ASA, reviewed pre-e s/s, reasons to seek care. Home bp's have been great, continue to check  4) H/O gastric bypass   Meds: No orders of the defined types were placed in this encounter.  Labs/procedures today: sve, informal u/s  Plan:  Continue routine obstetrical care  Next visit: prefers in person    Reviewed: Term labor symptoms and general obstetric precautions including but not limited to vaginal bleeding, contractions, leaking of fluid and fetal movement were reviewed in detail with the patient.  All questions were answered. Has home bp cuff.  Check bp weekly, let us know if >  140/90.   Follow-up: Return in about 1 week (around 08/27/2020) for LROB, CNM, in person.  Future Appointments  Date Time Provider Department Center  08/21/2020 10:00 AM [facility] SCHED ROOM [facility] None  08/28/2020  2:30 PM Raelyn Mora, CNM [facility]    No orders of the defined types were placed in this encounter.  Cheral Marker CNM, [facility] 08/20/2020 5:09 PM

## 2020-08-21 ENCOUNTER — Inpatient Hospital Stay (HOSPITAL_COMMUNITY): Payer: 59

## 2020-08-27 ENCOUNTER — Encounter: Payer: Self-pay | Admitting: Obstetrics & Gynecology

## 2020-08-27 ENCOUNTER — Other Ambulatory Visit: Payer: Self-pay | Admitting: Obstetrics & Gynecology

## 2020-08-27 ENCOUNTER — Other Ambulatory Visit: Payer: Self-pay

## 2020-08-27 ENCOUNTER — Ambulatory Visit (INDEPENDENT_AMBULATORY_CARE_PROVIDER_SITE_OTHER): Payer: Self-pay | Admitting: Obstetrics & Gynecology

## 2020-08-27 VITALS — BP 129/79 | HR 84 | Wt 272.0 lb

## 2020-08-27 DIAGNOSIS — Z3480 Encounter for supervision of other normal pregnancy, unspecified trimester: Secondary | ICD-10-CM

## 2020-08-27 DIAGNOSIS — O321XX0 Maternal care for breech presentation, not applicable or unspecified: Secondary | ICD-10-CM

## 2020-08-27 DIAGNOSIS — Z029 Encounter for administrative examinations, unspecified: Secondary | ICD-10-CM

## 2020-08-27 NOTE — Progress Notes (Signed)
   LOW-RISK PREGNANCY VISIT Patient name: Rebecca [name] MRN [id number]  Date of birth: [birth year] Chief Complaint:   Routine Prenatal Visit (Want be check)  History of Present Illness:   Rebecca [name] is a 35 y.o. G82P1001 female at [redacted]w[redacted]d with an Estimated Date of Delivery: 09/10/20 being seen today for ongoing management of a low-risk pregnancy.  Depression screen [facility] 2/9 03/07/2020 01/04/2020 02/07/2019 05/03/2017 03/29/2017  Decreased Interest 0 0 0 0 0  Down, Depressed, Hopeless 0 0 0 0 0  PHQ - 2 Score 0 0 0 0 0  Altered sleeping 0 - 0 0 -  Tired, decreased energy 0 - 0 1 -  Change in appetite 0 - 0 0 -  Feeling bad or failure about yourself  0 - 0 0 -  Trouble concentrating 0 - 0 0 -  Moving slowly or fidgety/restless 0 - 0 0 -  Suicidal thoughts 0 - 0 0 -  PHQ-9 Score 0 - 0 1 -  Difficult doing work/chores - - Not difficult at all Not difficult at all -    [name]. Contractions: Irregular.  .  Movement: Present. denies leaking of fluid. Review of Systems:   Pertinent items are noted in HPI Denies abnormal vaginal discharge w/ itching/odor/irritation, headaches, visual changes, shortness of breath, chest pain, abdominal pain, severe nausea/vomiting, or problems with urination or bowel movements unless otherwise stated above. Pertinent History Reviewed:  Reviewed past medical,surgical, social, obstetrical and family history.  Reviewed problem list, medications and allergies. Physical Assessment:   Vitals:   08/27/20 1016  BP: 129/79  Pulse: 84  Weight: 272 lb (123.4 kg)  Body mass index is 36.89 kg/m.        Physical Examination:   General appearance: Well appearing, and in no distress  Mental status: Alert, oriented to person, place, and time  Skin: Warm & dry  Cardiovascular: Normal heart rate noted  Respiratory: Normal respiratory effort, no distress  Abdomen: Soft, gravid, nontender  Pelvic: Cervical exam performed         Extremities: Edema:  None  Fetal Status: Fetal Heart Rate (bpm): 145 Fundal Height: 37 cm Movement: Present    Chaperone: Angel Neas   No results found for this or any previous visit (from the past 24 hour(s)).  Assessment & Plan:  1) Low-risk pregnancy G2P1001 at [redacted]w[redacted]d with an Estimated Date of Delivery: 09/10/20   2) , Breech declines ECV, 09/04/20 primary C section 1730   Meds: No orders of the defined types were placed in this encounter.  Labs/procedures today:   Plan:  Continue routine obstetrical care  Next visit: prefers in person    Reviewed: Term labor symptoms and general obstetric precautions including but not limited to vaginal bleeding, contractions, leaking of fluid and fetal movement were reviewed in detail with the patient.  All questions were answered. Has home bp cuff. Rx faxed to . Check bp weekly, let us know if >140/90.   Follow-up: Return in about 17 days (around 09/13/2020) for Post Op, with Dr Despina Hidden.  No future appointments.  No orders of the defined types were placed in this encounter.  Lazaro Arms  08/27/2020 10:47 AM

## 2020-08-28 ENCOUNTER — Encounter: Payer: Self-pay | Admitting: *Deleted

## 2020-08-28 ENCOUNTER — Encounter: Payer: 59 | Admitting: Obstetrics and Gynecology

## 2020-09-01 ENCOUNTER — Inpatient Hospital Stay (HOSPITAL_COMMUNITY): Payer: 59 | Admitting: Anesthesiology

## 2020-09-01 ENCOUNTER — Other Ambulatory Visit: Payer: Self-pay

## 2020-09-01 ENCOUNTER — Encounter (HOSPITAL_COMMUNITY): Payer: Self-pay | Admitting: Obstetrics & Gynecology

## 2020-09-01 ENCOUNTER — Inpatient Hospital Stay (HOSPITAL_COMMUNITY)
Admission: AD | Admit: 2020-09-01 | Discharge: 2020-09-03 | DRG: 807 | Disposition: A | Discharge status: Home / Self Care | Payer: 59 | Attending: Obstetrics & Gynecology | Admitting: Obstetrics & Gynecology

## 2020-09-01 ENCOUNTER — Other Ambulatory Visit: Payer: Self-pay | Admitting: Obstetrics & Gynecology

## 2020-09-01 DIAGNOSIS — Z7982 Long term (current) use of aspirin: Secondary | ICD-10-CM | POA: Diagnosis not present

## 2020-09-01 DIAGNOSIS — Z23 Encounter for immunization: Secondary | ICD-10-CM

## 2020-09-01 DIAGNOSIS — Z20822 Contact with and (suspected) exposure to covid-19: Secondary | ICD-10-CM | POA: Diagnosis present

## 2020-09-01 DIAGNOSIS — Z3490 Encounter for supervision of normal pregnancy, unspecified, unspecified trimester: Secondary | ICD-10-CM

## 2020-09-01 DIAGNOSIS — Z9884 Bariatric surgery status: Secondary | ICD-10-CM

## 2020-09-01 DIAGNOSIS — Z3A38 38 weeks gestation of pregnancy: Secondary | ICD-10-CM

## 2020-09-01 DIAGNOSIS — Z283 Underimmunization status: Secondary | ICD-10-CM

## 2020-09-01 DIAGNOSIS — O99844 Bariatric surgery status complicating childbirth: Secondary | ICD-10-CM | POA: Diagnosis not present

## 2020-09-01 DIAGNOSIS — Z2839 Other underimmunization status: Secondary | ICD-10-CM

## 2020-09-01 DIAGNOSIS — O43103 Malformation of placenta, unspecified, third trimester: Secondary | ICD-10-CM | POA: Diagnosis not present

## 2020-09-01 DIAGNOSIS — Z3480 Encounter for supervision of other normal pregnancy, unspecified trimester: Secondary | ICD-10-CM

## 2020-09-01 DIAGNOSIS — O43123 Velamentous insertion of umbilical cord, third trimester: Secondary | ICD-10-CM | POA: Diagnosis not present

## 2020-09-01 DIAGNOSIS — Z8679 Personal history of other diseases of the circulatory system: Secondary | ICD-10-CM

## 2020-09-01 DIAGNOSIS — Z8759 Personal history of other complications of pregnancy, childbirth and the puerperium: Secondary | ICD-10-CM

## 2020-09-01 DIAGNOSIS — O26893 Other specified pregnancy related conditions, third trimester: Secondary | ICD-10-CM | POA: Diagnosis present

## 2020-09-01 LAB — CBC
HCT: 39.7 % (ref 36.0–46.0)
Hemoglobin: 12.8 g/dL (ref 12.0–15.0)
MCH: 27.8 pg (ref 26.0–34.0)
MCHC: 32.2 g/dL (ref 30.0–36.0)
MCV: 86.3 fL (ref 80.0–100.0)
Platelets: 253 10*3/uL (ref 150–400)
RBC: 4.6 MIL/uL (ref 3.87–5.11)
RDW: 15.6 % — ABNORMAL HIGH (ref 11.5–15.5)
WBC: 9.3 10*3/uL (ref 4.0–10.5)
nRBC: 0 % (ref 0.0–0.2)

## 2020-09-01 LAB — SARS CORONAVIRUS 2 BY RT PCR (HOSPITAL ORDER, PERFORMED IN ~~LOC~~ HOSPITAL LAB): SARS Coronavirus 2: NEGATIVE

## 2020-09-01 LAB — RPR: RPR Ser Ql: NONREACTIVE

## 2020-09-01 LAB — TYPE AND SCREEN
ABO/RH(D): A POS
Antibody Screen: NEGATIVE

## 2020-09-01 MED ORDER — IBUPROFEN 600 MG PO TABS
600.0000 mg | ORAL_TABLET | Freq: Four times a day (QID) | ORAL | Status: DC
  Administered 2020-09-01 – 2020-09-03 (×8): 600 mg via ORAL
  Filled 2020-09-01 (×8): qty 1

## 2020-09-01 MED ORDER — FENTANYL CITRATE (PF) 100 MCG/2ML IJ SOLN: 50.0000 ug | INTRAMUSCULAR | Status: DC | PRN

## 2020-09-01 MED ORDER — FENTANYL-BUPIVACAINE-NACL 0.5-0.125-0.9 MG/250ML-% EP SOLN
12.0000 mL/h | EPIDURAL | Status: DC | PRN
  Administered 2020-09-01: 14 mL/h via EPIDURAL
  Filled 2020-09-01: qty 250

## 2020-09-01 MED ORDER — TETANUS-DIPHTH-ACELL PERTUSSIS 5-2.5-18.5 LF-MCG/0.5 IM SUSY: 0.5000 mL | PREFILLED_SYRINGE | Freq: Once | INTRAMUSCULAR | Status: DC

## 2020-09-01 MED ORDER — ZOLPIDEM TARTRATE 5 MG PO TABS: 5.0000 mg | ORAL_TABLET | Freq: Every evening | ORAL | Status: DC | PRN

## 2020-09-01 MED ORDER — LACTATED RINGERS IV SOLN
500.0000 mL | Freq: Once | INTRAVENOUS | Status: AC
  Administered 2020-09-01: 500 mL via INTRAVENOUS

## 2020-09-01 MED ORDER — SIMETHICONE 80 MG PO CHEW: 80.0000 mg | CHEWABLE_TABLET | ORAL | Status: DC | PRN

## 2020-09-01 MED ORDER — OXYCODONE-ACETAMINOPHEN 5-325 MG PO TABS: 1.0000 | ORAL_TABLET | ORAL | Status: DC | PRN

## 2020-09-01 MED ORDER — TERBUTALINE SULFATE 1 MG/ML IJ SOLN: 0.2500 mg | Freq: Once | INTRAMUSCULAR | Status: DC | PRN

## 2020-09-01 MED ORDER — LACTATED RINGERS IV SOLN: 500.0000 mL | INTRAVENOUS | Status: DC | PRN

## 2020-09-01 MED ORDER — DIBUCAINE (PERIANAL) 1 % EX OINT: 1.0000 "application " | TOPICAL_OINTMENT | CUTANEOUS | Status: DC | PRN

## 2020-09-01 MED ORDER — MISOPROSTOL 25 MCG QUARTER TABLET
25.0000 ug | ORAL_TABLET | ORAL | Status: DC | PRN
Start: 2020-09-01 — End: 2020-09-01

## 2020-09-01 MED ORDER — EPHEDRINE 5 MG/ML INJ: 10.0000 mg | INTRAVENOUS | Status: DC | PRN

## 2020-09-01 MED ORDER — DIPHENHYDRAMINE HCL 25 MG PO CAPS: 25.0000 mg | ORAL_CAPSULE | Freq: Four times a day (QID) | ORAL | Status: DC | PRN

## 2020-09-01 MED ORDER — LACTATED RINGERS IV SOLN: INTRAVENOUS | Status: DC

## 2020-09-01 MED ORDER — FLEET ENEMA 7-19 GM/118ML RE ENEM: 1.0000 | ENEMA | Freq: Every day | RECTAL | Status: DC | PRN

## 2020-09-01 MED ORDER — OXYTOCIN-SODIUM CHLORIDE 30-0.9 UT/500ML-% IV SOLN
1.0000 m[IU]/min | INTRAVENOUS | Status: DC
  Administered 2020-09-01: 2 m[IU]/min via INTRAVENOUS

## 2020-09-01 MED ORDER — LIDOCAINE HCL (PF) 1 % IJ SOLN: 30.0000 mL | INTRAMUSCULAR | Status: DC | PRN

## 2020-09-01 MED ORDER — MAGNESIUM HYDROXIDE 400 MG/5ML PO SUSP: 30.0000 mL | ORAL | Status: DC | PRN

## 2020-09-01 MED ORDER — PHENYLEPHRINE 40 MCG/ML (10ML) SYRINGE FOR IV PUSH (FOR BLOOD PRESSURE SUPPORT): 80.0000 ug | PREFILLED_SYRINGE | INTRAVENOUS | Status: DC | PRN

## 2020-09-01 MED ORDER — OXYCODONE-ACETAMINOPHEN 5-325 MG PO TABS: 2.0000 | ORAL_TABLET | ORAL | Status: DC | PRN

## 2020-09-01 MED ORDER — LIDOCAINE HCL (PF) 1 % IJ SOLN
INTRAMUSCULAR | Status: DC | PRN
  Administered 2020-09-01 (×2): 5 mL via EPIDURAL

## 2020-09-01 MED ORDER — HYDROXYZINE HCL 50 MG PO TABS
50.0000 mg | ORAL_TABLET | Freq: Four times a day (QID) | ORAL | Status: DC | PRN
Start: 2020-09-01 — End: 2020-09-01

## 2020-09-01 MED ORDER — DIPHENHYDRAMINE HCL 50 MG/ML IJ SOLN: 12.5000 mg | INTRAMUSCULAR | Status: DC | PRN

## 2020-09-01 MED ORDER — OXYTOCIN BOLUS FROM INFUSION
333.0000 mL | Freq: Once | INTRAVENOUS | Status: AC
  Administered 2020-09-01: 333 mL via INTRAVENOUS

## 2020-09-01 MED ORDER — SOD CITRATE-CITRIC ACID 500-334 MG/5ML PO SOLN: 30.0000 mL | ORAL | Status: DC | PRN

## 2020-09-01 MED ORDER — BENZOCAINE-MENTHOL 20-0.5 % EX AERO
1.0000 "application " | INHALATION_SPRAY | CUTANEOUS | Status: DC | PRN
  Administered 2020-09-01: 1 via TOPICAL
  Filled 2020-09-01: qty 56

## 2020-09-01 MED ORDER — MEASLES, MUMPS & RUBELLA VAC IJ SOLR
0.5000 mL | Freq: Once | INTRAMUSCULAR | Status: AC
  Administered 2020-09-03: 0.5 mL via SUBCUTANEOUS
  Filled 2020-09-01: qty 0.5

## 2020-09-01 MED ORDER — ONDANSETRON HCL 4 MG PO TABS: 4.0000 mg | ORAL_TABLET | ORAL | Status: DC | PRN

## 2020-09-01 MED ORDER — OXYTOCIN-SODIUM CHLORIDE 30-0.9 UT/500ML-% IV SOLN
2.5000 [IU]/h | INTRAVENOUS | Status: DC
  Filled 2020-09-01: qty 500

## 2020-09-01 MED ORDER — WITCH HAZEL-GLYCERIN EX PADS: 1.0000 "application " | MEDICATED_PAD | CUTANEOUS | Status: DC | PRN

## 2020-09-01 MED ORDER — ONDANSETRON HCL 4 MG/2ML IJ SOLN: 4.0000 mg | Freq: Four times a day (QID) | INTRAMUSCULAR | Status: DC | PRN

## 2020-09-01 MED ORDER — ACETAMINOPHEN 325 MG PO TABS: 650.0000 mg | ORAL_TABLET | ORAL | Status: DC | PRN

## 2020-09-01 MED ORDER — PRENATAL MULTIVITAMIN CH
1.0000 | ORAL_TABLET | Freq: Every day | ORAL | Status: DC
  Administered 2020-09-02 – 2020-09-03 (×2): 1 via ORAL
  Filled 2020-09-01 (×2): qty 1

## 2020-09-01 MED ORDER — COCONUT OIL OIL: 1.0000 "application " | TOPICAL_OIL | Status: DC | PRN

## 2020-09-01 MED ORDER — ONDANSETRON HCL 4 MG/2ML IJ SOLN: 4.0000 mg | INTRAMUSCULAR | Status: DC | PRN

## 2020-09-01 NOTE — H&P (Signed)
Obstetric History and Physical  Olina SHERANDA SEABROOKS is a 35 y.o. G2P1001 with IUP at [redacted]w[redacted]d presenting in labor.  Had frequent contractions earlier today, initially checked in MAU and was 3/50/high with bloody show. Was observed for about two hours, reassuring FHR tracing except for some variable decelerations.  Recheck was 4.5/50/-1 with more bloody show. Plan made for admission. Of note, patient had been scheduled for cesarean section and BTL on 09/04/20 due to breech presentation; cephalic fetal presentation noted today on exam and bedside scan. Patient states she has been having  irregular, every 6-7 minutes contractions, minimal vaginal bleeding, intact membranes, with active fetal movement.    Prenatal Course [facility] Pregnancy complications or risks: Active Problems:   S/P gastric bypass   Rubella non-immune status, antepartum   History of gestational hypertension   History of postpartum hypertension   Encounter for supervision of normal pregnancy, antepartum   [facility] RESULTS  Language English Pap 09/01/2020 neg  Initiated care at 13wk GC/CT Initial: -/-           36wks:  Dating by LMP c/w 8wk U/S    Support person Husband Genetics NT/IT: neg/neg    AFP:      Panorama:     Carrier Screen declined  Flu vaccine 05/23/20 (at work) [place]/Hgb Elec declined  TDaP vaccine 06/03/20    Rhogam n/a Blood Type A/Positive/-- (08/05 1657)    Antibody Negative (10/27 0839)  Anatomy US Normal girl '[place]' HBsAg Negative (08/05 1657)  Feeding Plan breast RPR Non Reactive (10/27 0839)  Contraception Discussed 09/01/2020 & 09/01/20  Rubella  <0.90 (08/05 1657)  Circumcision n/a HIV Non Reactive (10/27 0839)  P[facility]ian [facility] Hep C neg  Prenatal Classes declined      A1C/GTT  26-28wks:normal  BTL Consent N/a, private insurance    VBAC Consent n/a GBS Negative          Past Medical History:  Diagnosis Date  . Pregnant 03/29/2017  . S/P gastric bypass     Past Surgical  History:  Procedure Laterality Date  . rny  03/09/2011  . ROUX-EN-Y PROCEDURE  03/09/11  . TONSILLECTOMY      OB History  Gravida Para Term Preterm AB Living  2 1 1     1   SAB IAB Ectopic Multiple Live Births        0 1    # Outcome Date GA Lbr Len/2nd Weight Sex Delivery Anes PTL Lv  2 Current           1 Term 11/21/17 [redacted]w[redacted]d 10:30 / 01:46 3391 g M Vag-Spont EPI  LIV    Social History   Socioeconomic History  . Marital status: Married    Spouse name: Not on file  . Number of children: Not on file  . Years of education: Not on file  . Highest education level: Not on file  Occupational History  . 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 Smoking status: Never Smoker  . Smokeless tobacco: Never Used  Vaping Use  . Vaping Use: Never used  Substance and Sexual Activity  . Alcohol use: No    Alcohol/week: 4.0 standard drinks    Types: 4 Glasses of wine per week    Comment: occ before pregnancy  . Drug use: No  . Sexual activity: Yes    Partners: Male    Birth control/protection: None  Other Topics Concern  . Not on file  Social History Narrative  . Not on file   Social Determinants of Health   Financial Resource Strain: Low Risk   . Difficulty of Paying Living Expenses: Not hard at all  Food Insecurity: No Food Insecurity  . Worried About Programme researcher, broadcasting/film/video in the Last Year: Never true  . Ran Out of Food in the Last Year: Never true  Transportation Needs: No Transportation Needs  . Lack of Transportation (Medical): No  . Lack of Transportation (Non-Medical): No  Physical Activity: Sufficiently Active  . Days of Exercise per Week: 5 days  . Minutes of Exercise per Session: 30 min  Stress: No Stress Concern Present  . Feeling of Stress : Not at all  Social Connections: Moderately Isolated  . Frequency of Communication with Friends and Family: More than three times a week  . Frequency of Social Gatherings with Friends and Family: More than three times a  week  . Attends Religious Services: Never  . Active Member of Clubs or Organizations: No  . Attends Banker Meetings: Never  . Marital Status: Married    Family History  Problem Relation Age of Onset  . Anxiety disorder Sister   . Hypertension Maternal Grandmother   . Hypertension Maternal Grandfather   . Atrial fibrillation Maternal Grandfather   . Breast cancer Maternal Aunt 45       1 Maternal Aunt - Amy    Medications Prior to Admission  Medication Sig Dispense Refill Last Dose  . aspirin EC 81 MG tablet Take 2 tablets (162 mg total) by mouth daily. 180 tablet 2 08/31/2020 at Unknown time  . calcium carbonate (OS-CAL) 1250 (500 Ca) MG chewable tablet Chew 1 tablet by mouth daily.   08/31/2020 at Unknown time  . ferrous sulfate 325 (65 FE) MG tablet Take 325 mg by mouth daily with breakfast.   08/31/2020 at Unknown time  . Prenatal Vit-Fe Fumarate-FA (MULTIVITAMIN-PRENATAL) 27-0.8 MG TABS tablet Take 1 tablet by mouth daily.   08/31/2020 at Unknown time  . gabapentin (NEURONTIN) 100 MG capsule Take 1 capsule (100 mg total) by mouth 3 (three) times daily. 90 capsule 2 More than a month at Unknown time    No Known Allergies  Review of Systems: Negative except for what is mentioned in HPI.  Physical Exam: BP 139/85 (BP Location: Right Arm)   Pulse 73   Temp 98.4 F (36.9 C) (Oral)   Resp 17   Wt 122.9 kg   LMP 12/05/2019   SpO2 99%   BMI 36.75 kg/m  CONSTITUTIONAL: Well-developed, well-nourished female in no acute distress.  HENT:  Normocephalic, atraumatic, External right and left ear normal. Oropharynx is clear and moist EYES: Conjunctivae and EOM are normal. Pupils are equal, round, and reactive to light. No scleral icterus.  NECK: Normal range of motion, supple, no masses SKIN: Skin is warm and dry. No rash noted. Not diaphoretic. No erythema. No pallor. NEUROLOGIC: Alert and oriented to person, place, and time. Normal reflexes, muscle tone coordination. No  cranial nerve deficit noted. PSYCHIATRIC: Normal mood and affect. Normal behavior. Normal judgment and thought content. CARDIOVASCULAR: Normal heart rate noted, regular rhythm RESPIRATORY: Effort and breath sounds normal, no problems with respiration noted ABDOMEN: Soft, nontender, nondistended, gravid. MUSCULOSKELETAL: Normal range of motion. No edema and no tenderness. 2+ distal pulses.  Cervical Exam: Dilatation 4.5cm   Effacement 50%   Station -1   Presentation: cephalic on exam and bedside scan FHT:  Baseline rate 145 bpm  Variability moderate  Accelerations present   Decelerations variable Contractions: Every 7-9 mins   Pertinent Labs/Studies:   Admission labs ordered, will follow up results and manage accordingly.  Assessment : DHAMAR GREGORY is a 35 y.o. G2P1001 at [redacted]w[redacted]d being admitted for labor  Plan: Labor:  Expectant management for now. Augmentation as ordered as per protocol. Analgesia as needed. FWB: Overall reassuring fetal heart tracing . GBS positive Delivery plan: Hopeful for vaginal delivery    Jaynie Collins, MD, FACOG Obstetrician & Gynecologist, [facility] for [employer], [facility]

## 2020-09-01 NOTE — Progress Notes (Signed)
PANTERA WINTERROWD is a 35 y.o. G2P1001 at [redacted]w[redacted]d.  Subjective: Coping well w/ UC's. Requesting epidural.   Objective: BP 136/66   Pulse 65   Temp 98.1 F (36.7 C) (Oral)   Resp 16   Wt 122.9 kg   LMP 12/05/2019   SpO2 99%   BMI 36.75 kg/m    FHT:  FHR: 145 bpm, variability: mod,  accelerations:  15x15,  decelerations:  Mild variables UC:   Q 2-4 minutes, moderate Dilation: 5 Effacement (%): 70 Cervical Position: Middle Station: -1 Presentation: Vertex Exam by:: Dorathy Kinsman, CNM   Labs: Results for orders placed or performed during the hospital encounter of 09/01/20 (from the past 24 hour(s))  CBC     Status: Abnormal   Collection Time: 09/01/20 10:11 AM  Result Value Ref Range   WBC 9.3 4.0 - 10.5 K/uL   RBC 4.60 3.87 - 5.11 MIL/uL   Hemoglobin 12.8 12.0 - 15.0 g/dL   HCT 12.1 62.4 - 46.9 %   MCV 86.3 80.0 - 100.0 fL   MCH 27.8 26.0 - 34.0 pg   MCHC 32.2 30.0 - 36.0 g/dL   RDW 50.7 (H) 22.5 - 75.0 %   Platelets 253 150 - 400 K/uL   nRBC 0.0 0.0 - 0.2 %  Type and screen [facility]     Status: None (Preliminary result)   Collection Time: 09/01/20 10:11 AM  Result Value Ref Range   ABO/RH(D) PENDING    Antibody Screen PENDING    Sample Expiration      09/04/2020,2359 Performed at [facility], [address] [place], [place] [address]     Assessment / Plan: [redacted]w[redacted]d week IUP Labor: Early Fetal Wellbeing:  Category I Pain Control:  Comfort mesures. Requesting epidural.  Anticipated MOD:  SVD. Discussed PP BTL.  [place], [place], CNM 09/01/2020 10:57 AM

## 2020-09-01 NOTE — MAU Note (Signed)
Started contracting around 0300, now 6-7 mins apart.  Denies LOF/VB.  + FM.

## 2020-09-01 NOTE — Anesthesia Preprocedure Evaluation (Signed)
Anesthesia Evaluation  Patient identified by MRN, date of birth, ID band Patient awake    Reviewed: Allergy & Precautions, Patient's Chart, lab work & pertinent test results  Airway Mallampati: II  TM Distance: >3 FB Neck ROM: Full    Dental no notable dental hx. (+) Teeth Intact   Pulmonary neg pulmonary ROS,    Pulmonary exam normal breath sounds clear to auscultation       Cardiovascular negative cardio ROS Normal cardiovascular exam Rhythm:Regular Rate:Normal     Neuro/Psych negative neurological ROS  negative psych ROS   GI/Hepatic Neg liver ROS, GERD  Medicated and Controlled,  Endo/Other  Obesity  Renal/GU negative Renal ROS  negative genitourinary   Musculoskeletal negative musculoskeletal ROS (+)   Abdominal (+) + obese,   Peds  Hematology   Anesthesia Other Findings   Reproductive/Obstetrics (+) Pregnancy                             Anesthesia Physical Anesthesia Plan  ASA: II  Anesthesia Plan: Epidural   Post-op Pain Management:    Induction:   PONV Risk Score and Plan:   Airway Management Planned: Natural Airway  Additional Equipment:   Intra-op Plan:   Post-operative Plan:   Informed Consent: I have reviewed the patients History and Physical, chart, labs and discussed the procedure including the risks, benefits and alternatives for the proposed anesthesia with the patient or authorized representative who has indicated his/her understanding and acceptance.       Plan Discussed with: Anesthesiologist  Anesthesia Plan Comments:         Anesthesia Quick Evaluation

## 2020-09-01 NOTE — MAU Note (Signed)
Dr. Macon Large present in room to confirm fetal presentation with bedside ultrasound; vertex position confirmed.

## 2020-09-01 NOTE — Lactation Note (Signed)
This note was copied from a baby's chart. Lactation Consultation Note  Patient Name: Rebecca [name] Date: 09/01/2020 Reason for consult: L&D Initial assessment;Follow-up assessment;Mother's request;Difficult latch;1st time breastfeeding;Term;Other (Comment) (Gastric Bypass 2012) Age:35 hours  LC on arrival found infant prone on Mom's chest. LC assisted Mom to position infant over the left breast and got baby to latch with a teac cup hold. Mom's nipples are small and short shafted and required a tea cup hold to sustain. Dad assisting Mom with the teacup hold and infant still nursing at the end of the visit.  LC team to provide additional support once Mom is on the floor.

## 2020-09-01 NOTE — Anesthesia Procedure Notes (Signed)
Epidural Patient location during procedure: OB Start time: 09/01/2020 11:07 AM End time: 09/01/2020 11:14 AM  Staffing Anesthesiologist: Mal Amabile, MD Performed: anesthesiologist   Preanesthetic Checklist Completed: patient identified, IV checked, site marked, risks and benefits discussed, surgical consent, monitors and equipment checked, pre-op evaluation and timeout performed  Epidural Patient position: sitting Prep: DuraPrep and site prepped and draped Patient monitoring: continuous pulse ox and blood pressure Approach: midline Location: L3-L4 Injection technique: LOR air  Needle:  Needle type: Tuohy  Needle gauge: 17 G Needle length: 9 cm and 9 Needle insertion depth: 7 cm Catheter type: closed end flexible Catheter size: 19 Gauge Catheter at skin depth: 12 cm Test dose: negative and Other  Assessment Events: blood not aspirated, injection not painful, no injection resistance, no paresthesia and negative IV test  Additional Notes Patient identified. Risks and benefits discussed including failed block, incomplete  Pain control, post dural puncture headache, nerve damage, paralysis, blood pressure Changes, nausea, vomiting, reactions to medications-both toxic and allergic and post Partum back pain. All questions were answered. Patient expressed understanding and wished to proceed. Sterile technique was used throughout procedure. Epidural site was Dressed with sterile barrier dressing. No paresthesias, signs of intravascular injection Or signs of intrathecal spread were encountered.  Patient was more comfortable after the epidural was dosed. Please see RN's note for documentation of vital signs and FHR which are stable. Reason for block:procedure for pain

## 2020-09-01 NOTE — Discharge Summary (Addendum)
Postpartum Discharge Summary  Date of Service updated 09/03/20     Patient Name: Rebecca [name] DOB: 03-31-1986 MRN: [id number]  Date of admission: 09/01/2020 Delivery date:09/01/2020  Delivering provider: Manya Silvas  Date of discharge: 09/03/2020  Admitting diagnosis: Indication for care in labor or delivery [O75.9] Intrauterine pregnancy: [redacted]w[redacted]d    Secondary diagnosis:  Active Problems:   S/P gastric bypass   Rubella non-immune status, antepartum   History of gestational hypertension   History of postpartum hypertension   Encounter for supervision of normal pregnancy, antepartum   Indication for care in labor or delivery  Additional problems: none    Discharge diagnosis: Term Pregnancy Delivered                                              Post partum procedures:none Augmentation: None Complications: None  Hospital course: Onset of Labor With Vaginal Delivery      35y.o. yo G2P1001 at 367w5das admitted in Latent Labor on 09/01/2020. Patient had an uncomplicated labor course and progressed spontaneously:  Membrane Rupture Time/Date: 3:18 PM ,09/01/2020   Delivery Method:Vaginal, Spontaneous  Episiotomy: None  Lacerations:  2nd degree  Patient had an uncomplicated postpartum course.  She is ambulating, tolerating a regular diet, passing flatus, and urinating well. Patient is discharged home in stable condition on 09/03/20.  Newborn Data: Birth date:09/01/2020  Birth time:3:50 PM  Gender:Female  Living status:Living  Apgars:8 ,9  Weight:3105 g   Magnesium Sulfate received: No BMZ received: No Rhophylac:N/A MMR:Yes, ordered postpartum T-DaP:Given prenatally Flu: Yes Transfusion:No  Physical exam  Vitals:   09/02/20 0631 09/02/20 2025 09/03/20 0507 09/03/20 0733  BP: 125/66 128/82 137/84 137/86  Pulse: 60 77 62 65  Resp: '18 18 18   ' Temp: 98 F (36.7 C) 97.9 F (36.6 C) 98.1 F (36.7 C)   TempSrc: Oral Oral Oral   SpO2: 99% 100% 100%   Weight:        General: alert, cooperative and no distress Lochia: appropriate Uterine Fundus: firm Incision: N/A DVT Evaluation: No evidence of DVT seen on physical exam. Labs: Lab Results  Component Value Date   WBC 9.3 09/01/2020   HGB 12.8 09/01/2020   HCT 39.7 09/01/2020   MCV 86.3 09/01/2020   PLT 253 09/01/2020   CMP Latest Ref Rng & Units 03/07/2020  Glucose 65 - 99 mg/dL 95  BUN 6 - 20 mg/dL 11  Creatinine 0.57 - 1.00 mg/dL 0.68  Sodium 134 - 144 mmol/L 135  Potassium 3.5 - 5.2 mmol/L 4.8  Chloride 96 - 106 mmol/L 101  CO2 20 - 29 mmol/L 21  Calcium 8.7 - 10.2 mg/dL 9.0  Total Protein 6.0 - 8.5 g/dL 6.6  Total Bilirubin 0.0 - 1.2 mg/dL 0.4  Alkaline Phos 48 - 121 IU/L 77  AST 0 - 40 IU/L 19  ALT 0 - 32 IU/L 13   Edinburgh Score: Edinburgh Postnatal Depression Scale Screening Tool 09/01/2020  I have been able to laugh and see the funny side of things. (No Data)  I have looked forward with enjoyment to things. -  I have blamed myself unnecessarily when things went wrong. -  I have been anxious or worried for no good reason. -  I have felt scared or panicky for no good reason. -  Things have been getting on top of  me. -  I have been so unhappy that I have had difficulty sleeping. -  I have felt sad or miserable. -  I have been so unhappy that I have been crying. -  The thought of harming myself has occurred to me. Flavia Shipper Postnatal Depression Scale Total -     After visit meds:  Allergies as of 09/03/2020   No Known Allergies     Medication List    STOP taking these medications   aspirin EC 81 MG tablet   calcium carbonate 1250 (500 Ca) MG chewable tablet Commonly known as: OS-CAL   ferrous sulfate 325 (65 FE) MG tablet     TAKE these medications   acetaminophen 500 MG tablet Commonly known as: TYLENOL Take 2 tablets (1,000 mg total) by mouth every 6 (six) hours as needed (for pain scale < 4).   amLODipine 5 MG tablet Commonly known as: NORVASC Take 1  tablet (5 mg total) by mouth daily.   coconut oil Oil Apply 1 application topically as needed.   ibuprofen 600 MG tablet Commonly known as: ADVIL Take 1 tablet (600 mg total) by mouth every 6 (six) hours as needed.   prenatal multivitamin Tabs tablet Take 1 tablet by mouth daily at 12 noon.        Discharge home in stable condition Infant Feeding: Breast Infant Disposition:home with mother Discharge instruction: per After Visit Summary and Postpartum booklet. Activity: Advance as tolerated. Pelvic rest for 6 weeks.  Diet: routine diet Future Appointments: Future Appointments  Date Time Provider [place]  09/13/2020 10:30 AM Eure, Mertie Clause, MD [facility]   Follow up Visit:   Please schedule this patient for a Virtual postpartum visit in 4 weeks with the following provider: Any provider. Additional Postpartum F/U:BP check 1 week, elevated BP postpartum  Low risk pregnancy complicated by: Nothing Delivery mode:  Vaginal, Spontaneous  Anticipated Birth Control:  Unsure. Decided against BTL. Spouse considering vasectomy.    4/0/0867 Arrie Senate, MD

## 2020-09-02 NOTE — Lactation Note (Signed)
This note was copied from a baby's chart. Lactation Consultation Note  Patient Name: Rebecca [name] [id number] Date: 09/02/2020  P1, ETI female infant. Mom with hx: GHTN and gastric bypass. LC entered the room, Mom and baby asleep. Age:35 hours  Maternal Data    Feeding Feeding Type: Breast Fed  LATCH Score                   Interventions    Lactation Tools Discussed/Used     Consult Status      Danelle Earthly 09/02/2020, 12:21 AM

## 2020-09-02 NOTE — Progress Notes (Addendum)
Patient ID: Rebecca [name], female   DOB: [birth year], 35 y.o.   MRN: [id number] Post Partum Day 1 Subjective: Rebecca [name] is a 34 yo G2P2 PPD#1 from SVD '@[redacted]w[redacted]d' . Patient denies complaints. She reports getting up ad lib, urinating, passing gas, eating/drinking and denies N/V. She reports minimal lochia. She had one elevated BP to 137/94 '@240'  this AM, but denies HA, CP or vision changes.  She wants MMR vaccine.   Objective: Blood pressure 125/66, pulse 60, temperature 98 F (36.7 C), temperature source Oral, resp. rate 18, weight 122.9 kg, last menstrual period 12/05/2019, SpO2 99 %, unknown if currently breastfeeding.  Physical Exam:  General: alert, cooperative, appears stated age and no distress Lochia: appropriate Uterine Fundus: firm  DVT Evaluation: No evidence of DVT seen on physical exam. Negative Homan's sign. No cords or calf tenderness. No significant calf/ankle edema.  Recent Labs    09/01/20 1011  HGB 12.8  HCT 39.7    Assessment/Plan: Rebecca [name] is a 35 yo G2P2 PPD#1 from SVD '@[redacted]w[redacted]d' .  Routine Postpartum Care: Doing well, pain well-controlled.  -- Continue routine care, lactation support  -- Contraception: Vasectomy -- Feeding: Breast- lactation consulted -- Rubella NI: Will receive MMR vaccine before discharge -- Elevated BP: BP elevated to 137/94 this AM. Denies HA, CP or changes in vision. Will continue to monitor.  Dispo: Plan for discharge PPD #2.  LOS: 1 day   Lowella Curb 09/02/2020, 7:33 AM   Midwife attestation I have seen and examined this patient and agree with above documentation in the student's note.   Post Partum Day 1  Rebecca [name] is a 35 y.o. [id number] s/p SVD.  Pt denies problems with ambulating, voiding or po intake. Pain is well controlled. Method of Feeding: breast  PE:  Gen: well appearing Heart: reg rate Lungs: normal WOB Fundus firm Ext: soft, no pain, no edema  Assessment: S/p SVD PPD #1  Plan for discharge:  tomorrow  Julianne Handler, CNM 11:12 AM

## 2020-09-02 NOTE — Lactation Note (Signed)
This note was copied from a baby's chart. Lactation Consultation Note  Patient Name: Rebecca [name] [id number] Date: 09/02/2020 Reason for consult: Initial assessment;1st time breastfeeding;Early term 37-38.6wks;Infant weight loss;Other (Comment) (mom has a hx  of gastric bypass and takes supplements as recommended. 3 % weight loss) Age:35 hours  Per mom the baby has been feeding often , but out of the 30 mins she stays  Latched probably feeds 15 mins.  Baby awake after a large stool and LC offered to assist with latch and mom receptive.  LC had mom massage, hand express, ( no drops noted ) , areola compressible.  Baby awake and latched and suckled a few times and sustained latch and sluggish .  Baby had last fed at 1730 for 15 mins.  See " reason for pumping for instructions for LC steps for latching to enhance the let down so the baby is more active when latched " . Mom and dad aware of the recommendations.  LC provided a hand pump for pre-pumping.  Due to hx of gastric bypass and few breast changes with pregnancy, recommended post pumping after 4 feedings a day both breast with DEBP for 10 10 -15 mins.   LC  To F/u this evening when mom calls and LC reported DEBP may need to set up.  LC also noted when baby was crying a stretchy skin notch under tongue , mid line.    Mom has the [facility] brochure with resource numbers and web site.    Maternal Data Has patient been taught Hand Expression?: Yes Does the patient have breastfeeding experience prior to this delivery?: No  Feeding Feeding Type: Breast Fed  LATCH Score Latch: Repeated attempts needed to sustain latch, nipple held in mouth throughout feeding, stimulation needed to elicit sucking reflex.  Audible Swallowing: None  Type of Nipple: Everted at rest and after stimulation  Comfort (Breast/Nipple): Soft / non-tender  Hold (Positioning): Assistance needed to correctly position infant at breast and maintain latch.  LATCH Score:  6  Interventions Interventions: Breast feeding basics reviewed;Assisted with latch;Breast massage;Skin to skin;Hand express;Breast compression;Adjust position;Support pillows;Position options  Lactation Tools Discussed/Used Tools: Pump;Flanges Flange Size: 24;27 Breast pump type: Manual Pump Education: Setup, frequency, and cleaning Initiated by:: MAI Date initiated:: 09/02/20   Consult Status Consult Status: Follow-up Date: 09/02/20 Follow-up type: In-patient    Matilde Sprang Yarrow Linhart 09/02/2020, 7:34 PM

## 2020-09-02 NOTE — Anesthesia Postprocedure Evaluation (Signed)
Anesthesia Post Note  Patient: MERLINA MARCHENA  Procedure(s) Performed: AN AD HOC LABOR EPIDURAL     Patient location during evaluation: Mother Baby Anesthesia Type: Epidural Level of consciousness: awake and alert Pain management: pain level controlled Vital Signs Assessment: post-procedure vital signs reviewed and stable Respiratory status: spontaneous breathing, nonlabored ventilation and respiratory function stable Cardiovascular status: stable Postop Assessment: no headache, no backache, epidural receding, patient able to bend at knees, no apparent nausea or vomiting, able to ambulate and adequate PO intake Anesthetic complications: no   No complications documented.  Last Vitals:  Vitals:   09/02/20 0240 09/02/20 0631  BP: (!) 137/94 125/66  Pulse: 68 60  Resp: 18 18  Temp: 36.6 C 36.7 C  SpO2: 99% 99%    Last Pain:  Vitals:   09/02/20 0720  TempSrc:   PainSc: 0-No pain   Pain Goal: Patients Stated Pain Goal: 2 (09/02/20 0631)                 Blythe Stanford

## 2020-09-03 ENCOUNTER — Other Ambulatory Visit (HOSPITAL_COMMUNITY): Payer: Self-pay | Admitting: Family Medicine

## 2020-09-03 MED ORDER — COVID-19 MRNA VACC (MODERNA) 100 MCG/0.5ML IM SUSP
0.5000 mL | Freq: Once | INTRAMUSCULAR | Status: AC
  Administered 2020-09-03: 0.5 mL via INTRAMUSCULAR
  Filled 2020-09-03 (×2): qty 0.5

## 2020-09-03 MED ORDER — AMLODIPINE BESYLATE 5 MG PO TABS
5.0000 mg | ORAL_TABLET | Freq: Every day | ORAL | Status: DC
  Administered 2020-09-03: 5 mg via ORAL
  Filled 2020-09-03: qty 1

## 2020-09-03 MED ORDER — AMLODIPINE BESYLATE 5 MG PO TABS: 5.0000 mg | ORAL_TABLET | Freq: Every day | ORAL | 3 refills | Status: DC

## 2020-09-03 MED ORDER — IBUPROFEN 600 MG PO TABS: 600.0000 mg | ORAL_TABLET | Freq: Four times a day (QID) | ORAL | 0 refills | Status: DC | PRN

## 2020-09-03 MED ORDER — COCONUT OIL OIL: 1.0000 "application " | TOPICAL_OIL | 0 refills | Status: DC | PRN

## 2020-09-03 MED ORDER — ACETAMINOPHEN 500 MG PO TABS: 1000.0000 mg | ORAL_TABLET | Freq: Four times a day (QID) | ORAL | Status: DC | PRN

## 2020-09-03 MED FILL — AMLODIPINE BESYLATE 5 MG TA: 5 | 30 days supply | Qty: 30 | Fill #0

## 2020-09-03 NOTE — Lactation Note (Addendum)
This note was copied from a baby's chart. Lactation Consultation Note  Patient Name: [name] [id number] Date: 09/03/2020 Reason for consult: Follow-up assessment Age:35 hours  Infant sleeping on Mom's chest during consult. Mom reports that her breasts feel heavier today. She reports that she has heard swallows "quite a bit." Mom is comfortable with latch. Parents felt that their previous consult with Rebecca [name] was helpful.   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 Mom reports continuing to take her bariatric supplements (including Vit B12) and having her labs drawn (last labs were drawn during [facility]). I let Mom know that a Roux-en-y can affect the amount of Vit B12 in breast milk, which is important for brain development. Mom understands to share with her pediatrician that she has had the Roux-en-y to ensure proper follow-up.   Mom knows she can call out for me to assist with latch prior to her d/c today. Infant has had excellent output over the last 24 hours.   Pacifier use noted at bassinet. I mentioned that pacifier use not recommended at this time as it may cover feeding cues, but Mom mentioned that "Rebecca [name]" has not seemed to like it.   [name] [facility] 09/03/2020, 7:50 AM

## 2020-09-03 NOTE — Lactation Note (Signed)
This note was copied from a baby's chart. Lactation Consultation Note Baby 37 hrs old. Mom had been feeding baby when [facility] entered room. Baby had fallen asleep. Mom woke baby up and repositioned baby to latch in cross cradle position. Baby was off and on frequently at first then fed well. Mom had good props for feeding support. Praised mom.  Patient Name: Rebecca [name] [id number] Date: 09/03/2020 Reason for consult: Follow-up assessment;Early term 37-38.6wks Age:26 hours  Maternal Data    Feeding Feeding Type: Breast Fed  LATCH Score Latch: Grasps breast easily, tongue down, lips flanged, rhythmical sucking.  Audible Swallowing: A few with stimulation  Type of Nipple: Everted at rest and after stimulation  Comfort (Breast/Nipple): Filling, red/small blisters or bruises, mild/mod discomfort (sore)  Hold (Positioning): No assistance needed to correctly position infant at breast.  LATCH Score: 8  Interventions Interventions: Breast feeding basics reviewed;Skin to skin;Support pillows  Lactation Tools Discussed/Used     Consult Status Consult Status: Follow-up Date: 09/03/20 Follow-up type: In-patient    Charyl Dancer 09/03/2020, 4:59 AM

## 2020-09-03 NOTE — Discharge Instructions (Signed)
-take tylenol 1000 mg every 6 hours as needed for pain, alternate with ibuprofen 600 mg every 6 hours -drink plenty of water to help with breastfeeding -continue prenatal vitamins while you are breastfeeding -think about birth control options-->bedisider.org is a great website!   Postpartum Care After Vaginal Delivery The following information offers guidance about how to care for yourself from the time you deliver your baby to 6-12 weeks after delivery (postpartum period). If you have problems or questions, contact your health care provider for more specific instructions. Follow these instructions at home: Vaginal bleeding  It is normal to have vaginal bleeding (lochia) after delivery. Wear a sanitary pad for bleeding and discharge. ? During the first week after delivery, the amount and appearance of lochia is often similar to a menstrual period. ? Over the next few weeks, it will gradually decrease to a dry, yellow-brown discharge. ? For most women, lochia stops completely by 4-6 weeks after delivery, but can vary.  Change your sanitary pads frequently. Watch for any changes in your flow, such as: ? A sudden increase in volume. ? A change in color. ? Large blood clots.  If you pass a blood clot from your vagina, save it and call your health care provider. Do not flush blood clots down the toilet before talking with your health care provider.  Do not use tampons or douches until your health care provider approves.  If you are not breastfeeding, your period should return 6-8 weeks after delivery. If you are feeding your baby breast milk only, your period may not return until you stop breastfeeding. Perineal care  Keep the area between the vagina and the anus (perineum) clean and dry. Use medicated pads and pain-relieving sprays and creams as directed.  If you had a surgical cut in the perineum (episiotomy) or a tear, check the area for signs of infection until you are healed. Check  for: ? More redness, swelling, or pain. ? Fluid or blood coming from the cut or tear. ? Warmth. ? Pus or a bad smell.  You may be given a squirt bottle to use instead of wiping to clean the perineum area after you use the bathroom. Pat the area gently to dry it.  To relieve pain caused by an episiotomy, a tear, or swollen veins in the anus (hemorrhoids), take a warm sitz bath 2-3 times a day. In a sitz bath, the warm water should only come up to your hips and cover your buttocks.   Breast care  In the first few days after delivery, your breasts may feel heavy, full, and uncomfortable (breast engorgement). Milk may also leak from your breasts. Ask your health care provider about ways to help relieve the discomfort.  If you are breastfeeding: ? Wear a bra that supports your breasts and fits well. Use breast pads to absorb milk that leaks. ? Keep your nipples clean and dry. Apply creams and ointments as told. ? You may have uterine contractions every time you breastfeed for up to several weeks after delivery. This helps your uterus return to its normal size. ? If you have any problems with breastfeeding, notify your health care provider or lactation consultant.  If you are not breastfeeding: ? Avoid touching your breasts. Do not squeeze out (express) milk. Doing this can make your breasts produce more milk. ? Wear a good-fitting bra and use cold packs to help with swelling. Intimacy and sexuality  Ask your health care provider when you can engage in sexual   sexual activity. This may depend upon: ? Your risk of infection. ? How fast you are healing. ? Your comfort and desire to engage in sexual activity.  You are able to get pregnant after delivery, even if you have not had your period. Talk with your health care provider about methods of birth control (contraception) or family planning if you desire future pregnancies. Medicines  Take over-the-counter and prescription medicines only as told by  your health care provider.  Take an over-the-counter stool softener to help ease bowel movements as told by your health care provider.  If you were prescribed an antibiotic medicine, take it as told by your health care provider. Do not stop taking the antibiotic even if you start to feel better.  Review all previous and current prescriptions to check for possible transfer into breast milk. Activity  Gradually return to your normal activities as told by your health care provider.  Rest as much as possible. Nap while your baby is sleeping. Eating and drinking  Drink enough fluid to keep your urine pale yellow.  To help prevent or relieve constipation, eat high-fiber foods every day.  Choose healthy eating to support breastfeeding or weight loss goals.  Take your prenatal vitamins until your health care provider tells you to stop.   General tips/recommendations  Do not use any products that contain nicotine or tobacco. These products include cigarettes, chewing tobacco, and vaping devices, such as e-cigarettes. If you need help quitting, ask your health care provider.  Do not drink alcohol, especially if you are breastfeeding.  Do not take medications or drugs that are not prescribed to you, especially if you are breastfeeding.  Visit your health care provider for a postpartum checkup within the first 3-6 weeks after delivery.  Complete a comprehensive postpartum visit no later than 12 weeks after delivery.  Keep all follow-up visits for you and your baby. Contact a health care provider if:  You feel unusually sad or worried.  Your breasts become red, painful, or hard.  You have a fever or other signs of an infection.  You have bleeding that is soaking through one pad an hour or you have blood clots.  You have a severe headache that doesn't go away or you have vision changes.  You have nausea and vomiting and are unable to eat or drink anything for 24 hours. Get help right  away if:  You have chest pain or difficulty breathing.  You have sudden, severe leg pain.  You faint or have a seizure.  You have thoughts about hurting yourself or your baby. If you ever feel like you may hurt yourself or others, or have thoughts about taking your own life, get help right away. Go to your nearest emergency department or:  Call your local emergency services (911 in the [place]  The National Suicide Prevention Lifeline at (706)223-1174. This suicide crisis helpline is open 24 hours a day.  Text the Crisis Text Line at (313) 456-9719 (in the [place] Summary  The period of time after you deliver your newborn up to 6-12 weeks after delivery is called the postpartum period.  Keep all follow-up visits for you and your baby.  Review all previous and current prescriptions to check for possible transfer into breast milk.  Contact a health care provider if you feel unusually sad or worried during the postpartum period. This information is not intended to replace advice given to you by your health care provider. Make sure you discuss any questions you  have with your health care provider. Document Revised: 04/04/2020 Document Reviewed: 04/04/2020 Elsevier Patient Education  2021 [employer].

## 2020-09-03 NOTE — Lactation Note (Signed)
This note was copied from a baby's chart. Lactation Consultation Note  Patient Name: Rebecca [name] Date: 09/03/2020   Age:35 hours  Mom is interested in being seen on an outpatient basis. Appt request for lactation outpatient clinic sent via Epic.   [name] [facility] 09/03/2020, 12:26 PM

## 2020-09-03 NOTE — Lactation Note (Signed)
This note was copied from a baby's chart. Lactation Consultation Note  Patient Name: Rebecca [name] [id number] Date: 09/03/2020 Reason for consult: Follow-up assessment Age:35 hours  Infant was cueing/being fussy when I entered room. Mom gave me permission to put on a glove to calm infant. I noted a frenulum under infant's tongue along with a slight heart-shape when infant extends or lateralizes tongue. Good suck noted on finger.  Mom has been using nipple butter b/c she felt her areola were chapped and cracked. I noted that Mom's nipples/areola were pink, but Mom said that they are usually pink, (although usually a little bit lighter).   Mom allowed me to observe a latch. I noted that [name] would only latch briefly with a few sucks and then fall asleep or come off. There were no signs of milk transfer. Parents affirmed that is what she usually does.   I asked Mom if we could supplement and she agreed. I asked if she preferred to supplement at the breast or with a bottle and she stated bottle. Infant did very well with the extra slow-flow nipple & I demonstrated paced bottle-feeding. Parents have Dr. Theora Gianotti bottles at home; I suggested the newborn/transitions nipple since that is comparable to the Enfamil extra-slow flow nipple.  Mom remembers being engorged when her milk came to volume with her 1st child. On palpation, I noted that her ductal tissue was concentrated in the lower portion of her breast and was not as prevalent as one might think based on the shape of her breasts.   Mom has a Research scientist (medical) at home. I asked Mom to pump after feedings/when infant gets formula with a goal of pumping 8 times/day.   Parents' questions were answered to their satisfaction.   [name] [facility] 09/03/2020, 11:10 AM

## 2020-09-04 ENCOUNTER — Inpatient Hospital Stay (HOSPITAL_COMMUNITY): Admission: AD | Admit: 2020-09-04 | Payer: 59 | Source: Home / Self Care | Admitting: Obstetrics & Gynecology

## 2020-09-04 SURGERY — Surgical Case
Anesthesia: Regional

## 2020-09-10 ENCOUNTER — Inpatient Hospital Stay (HOSPITAL_COMMUNITY): Admission: RE | Admit: 2020-09-10 | Payer: 59 | Source: Home / Self Care

## 2020-09-13 ENCOUNTER — Ambulatory Visit (INDEPENDENT_AMBULATORY_CARE_PROVIDER_SITE_OTHER): Payer: 59 | Admitting: Obstetrics & Gynecology

## 2020-09-13 ENCOUNTER — Other Ambulatory Visit: Payer: Self-pay

## 2020-09-13 ENCOUNTER — Encounter: Payer: Self-pay | Admitting: Obstetrics & Gynecology

## 2020-09-13 VITALS — BP 138/85 | HR 73 | Wt 252.0 lb

## 2020-09-13 DIAGNOSIS — Z0130 Encounter for examination of blood pressure without abnormal findings: Secondary | ICD-10-CM

## 2020-09-13 NOTE — Progress Notes (Signed)
Follow up appointment for results  Chief Complaint  Patient presents with  . Follow-up    Blood pressure 138/85, pulse 73, weight 252 lb (114.3 kg), not currently breastfeeding.   BP check on Norvasc 5 daily  MEDS ordered this encounter: No orders of the defined types were placed in this encounter.   Orders for this encounter: No orders of the defined types were placed in this encounter.   Impression: 1. BP check Continue norvasc 5 daily   Plan: As above 5 weeks pp visit  Follow Up: Return in about 5 weeks (around 10/18/2020) for post partum visit.       Face to face time:  10 minutes  Greater than 50% of the visit time was spent in counseling and coordination of care with the patient.  The summary and outline of the counseling and care coordination is summarized in the note above.   All questions were answered.  Past Medical History:  Diagnosis Date  . Pregnant 03/29/2017  . S/P gastric bypass     Past Surgical History:  Procedure Laterality Date  . rny  03/09/2011  . ROUX-EN-Y PROCEDURE  03/09/11  . TONSILLECTOMY      OB History    Gravida  2   Para  2   Term  2   Preterm      AB      Living  2     SAB      IAB      Ectopic      Multiple  0   Live Births  2           No Known Allergies  Social History   Socioeconomic History  . Marital status: Married    Spouse name: Not on file  . Number of children: Not on file  . Years of education: Not on file  . Highest education level: Not on file  Occupational History  . Occupation: Stylist with [employer]  Tobacco Use  . Smoking status: Never Smoker  . Smokeless tobacco: Never Used  Vaping Use  . Vaping Use: Never used  Substance and Sexual Activity  . Alcohol use: No    Alcohol/week: 4.0 standard drinks    Types: 4 Glasses of wine per week    Comment: occ before pregnancy  . Drug use: No  . Sexual activity: Yes    Partners: Male    Birth control/protection: None   Other Topics Concern  . Not on file  Social History Narrative  . Not on file   Social Determinants of Health   Financial Resource Strain: Low Risk   . Difficulty of Paying Living Expenses: Not hard at all  Food Insecurity: No Food Insecurity  . Worried About Programme researcher, broadcasting/film/video in the Last Year: Never true  . Ran Out of Food in the Last Year: Never true  Transportation Needs: No Transportation Needs  . Lack of Transportation (Medical): No  . Lack of Transportation (Non-Medical): No  Physical Activity: Sufficiently Active  . Days of Exercise per Week: 5 days  . Minutes of Exercise per Session: 30 min  Stress: No Stress Concern Present  . Feeling of Stress : Not at all  Social Connections: Moderately Isolated  . Frequency of Communication with Friends and Family: More than three times a week  . Frequency of Social Gatherings with Friends and Family: More than three times a week  . Attends Religious Services: Never  . Active Member of Clubs  or Organizations: No  . Attends Banker Meetings: Never  . Marital Status: Married    Family History  Problem Relation Age of Onset  . Anxiety disorder Sister   . Hypertension Maternal Grandmother   . Hypertension Maternal Grandfather   . Atrial fibrillation Maternal Grandfather   . Breast cancer Maternal Aunt 45       1 Maternal Aunt - Amy

## 2020-10-18 ENCOUNTER — Other Ambulatory Visit: Payer: Self-pay

## 2020-10-18 ENCOUNTER — Ambulatory Visit (INDEPENDENT_AMBULATORY_CARE_PROVIDER_SITE_OTHER): Payer: 59 | Admitting: Women's Health

## 2020-10-18 ENCOUNTER — Encounter: Payer: Self-pay | Admitting: Women's Health

## 2020-10-18 DIAGNOSIS — Z30430 Encounter for insertion of intrauterine contraceptive device: Secondary | ICD-10-CM | POA: Diagnosis not present

## 2020-10-18 MED ORDER — PARAGARD INTRAUTERINE COPPER IU IUD
INTRAUTERINE_SYSTEM | Freq: Once | INTRAUTERINE | Status: AC
  Administered 2020-10-18: 1 via INTRAUTERINE

## 2020-10-18 NOTE — Patient Instructions (Signed)
 Nothing in vagina for 3 days (no sex, douching, tampons, etc...)  Check your strings once a month to make sure you can feel them, if you are not able to please let us know  If you develop a fever of 100.4 or more in the next few weeks, or if you develop severe abdominal pain, please let us know  Use a backup method of birth control, such as condoms, for 2 weeks     Intrauterine Device Insertion, Care After This sheet gives you information about how to care for yourself after your procedure. Your health care provider may also give you more specific instructions. If you have problems or questions, contact your health care provider. What can I expect after the procedure? After the procedure, it is common to have:  Cramps and pain in the abdomen.  Bleeding. It may be light or heavy. This may last for a few days.  Lower back pain.  Dizziness.  Headaches.  Nausea. Follow these instructions at home:  Before resuming sexual activity, check to make sure that you can feel the IUD string or strings. You should be able to feel the end of the string below the opening of your cervix. If your IUD string is in place, you may resume sexual activity. ? If you had a hormonal IUD inserted more than 7 days after your most recent period started, you will need to use a backup method of birth control for 7 days after IUD insertion. Ask your health care provider whether this applies to you.  Continue to check that the IUD is still in place by feeling for the strings after every menstrual period, or once a month.  An IUD will not protect you from sexually transmitted infections (STIs). Use methods to prevent the exchange of body fluids between partners (barrier protection) every time you have sex. Barrier protection can be used during oral, vaginal, or anal sex. Commonly used barrier methods include: ? Female condom. ? Female condom. ? Dental dam.  Take over-the-counter and prescription medicines only as  told by your health care provider.  Keep all follow-up visits as told by your health care provider. This is important.   Contact a health care provider if:  You feel light-headed or weak.  You have any of the following problems with your IUD string or strings: ? The string bothers or hurts you or your sexual partner. ? You cannot feel the string. ? The string has gotten longer.  You can feel the IUD in your vagina.  You think you may be pregnant, or you miss your menstrual period.  You think you may have a sexually transmitted infection (STI). Get help right away if:  You have flu-like symptoms, such as tiredness (fatigue) and muscle aches.  You have a fever and chills.  You have bleeding that is heavier or lasts longer than a normal menstrual cycle.  You have abnormal or bad-smelling discharge from your vagina.  You develop abdominal pain that is new, is getting worse, or is not in the same area of earlier cramping and pain.  You have pain during sexual activity. Summary  After the procedure, it is common to have cramps and pain in the abdomen. It is also common to have light bleeding or heavier bleeding that is like your menstrual period.  Continue to check that the IUD is still in place by feeling for the strings after every menstrual period, or once a month.  Keep all follow-up visits as   told by your health care provider. This is important.  Contact your health care provider if you have problems with your IUD strings, such as the string getting longer or bothering you or your sexual partner. This information is not intended to replace advice given to you by your health care provider. Make sure you discuss any questions you have with your health care provider. Document Revised: 07/11/2019 Document Reviewed: 07/11/2019 Elsevier Patient Education  2021 Elsevier Inc.  

## 2020-10-18 NOTE — Addendum Note (Signed)
Addended by: Annamarie Dawley on: 10/18/2020 01:54 PM   Modules accepted: Orders

## 2020-10-18 NOTE — Progress Notes (Signed)
POSTPARTUM VISIT & IUD INSERTION Patient name: Rebecca [name] MRN [id number]  Date of birth: Sep 24, 1985 Chief Complaint:   Postpartum Care  History of Present Illness:   Rebecca [name] is a 35 y.o. G65P2002 Caucasian female being seen today for a postpartum visit and IUD insertion. She is 6 weeks postpartum following a spontaneous vaginal delivery at 38.5 gestational weeks. IOL: No, for n/a. Anesthesia: epidural.  Laceration: 2nd degree.  Complications: none. Inpatient contraception: no.   Pregnancy uncomplicated. Tobacco use: no. Substance use disorder: no. Last pap smear: 05/08/20 and results were NILM w/ HRHPV negative. Next pap smear due: 2024 No LMP recorded.  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 Bleeding no bleeding. Bowel function is normal. Bladder function is normal. Urinary incontinence? No, fecal incontinence? No Patient is not sexually active. Last sexual activity: prior to birth of baby.  Desired contraception: IUD. Patient does not want a pregnancy in the future.  Desired family size is 2 children.   Upstream - 10/18/20 0958      Pregnancy Intention Screening   Does the patient want to become pregnant in the next year? No    Does the patient's partner want to become pregnant in the next year? No    Would the patient like to discuss contraceptive options today? Yes      Contraception Wrap Up   Current Method Abstinence    End Method IUD or IUS    Contraception Counseling Provided Yes          The pregnancy intention screening data noted above was reviewed. Potential methods of contraception were discussed. The patient elected to proceed with IUD or IUS.   Edinburgh Postpartum Depression Screening: Negative  Edinburgh Postnatal Depression Scale - 10/18/20 0958      Edinburgh Postnatal Depression Scale:  In the Past 7 Days   I have been able to laugh and see the funny side of things. 0    I have looked forward with  enjoyment to things. 0    I have blamed myself unnecessarily when things went wrong. 2    I have been anxious or worried for no good reason. 1    I have felt scared or panicky for no good reason. 0    Things have been getting on top of me. 1    I have been so unhappy that I have had difficulty sleeping. 0    I have felt sad or miserable. 1    I have been so unhappy that I have been crying. 0    The thought of harming myself has occurred to me. 0    Edinburgh Postnatal Depression Scale Total 5          Baby's course has been uncomplicated. Baby is feeding by bottle. Infant has a pediatrician/family doctor? Yes.  Childcare strategy if returning to work/school: family.  Pt has material needs met for her and baby: Yes.   Review of Systems:   Pertinent items are noted in HPI Denies Abnormal vaginal discharge w/ itching/odor/irritation, headaches, visual changes, shortness of breath, chest pain, abdominal pain, severe nausea/vomiting, or problems with urination or bowel movements. Pertinent History Reviewed:  Reviewed past medical,surgical, obstetrical and family history.  Reviewed problem list, medications and allergies. OB History  Gravida Para Term Preterm AB Living  _0 SAB IAB Ectopic Multiple Live Births        0  2    # Outcome Date GA Lbr Len/2nd Weight Sex Delivery Anes PTL Lv  2 Term 09/01/20 [redacted]w[redacted]d 6 lb 13.5 oz (3.105 kg) F Vag-Spont EPI  LIV     Birth Comments: WNL   1 Term 11/21/17 377w1d0:30 / 01:46 7 lb 7.6 oz (3.391 kg) M Vag-Spont EPI  LIV   Physical Assessment:   Vitals:   10/18/20 0956  BP: 127/80  Pulse: 82  Weight: 263 lb (119.3 kg)  Height: _0  (1.854 m)  Body mass index is 34.7 kg/m.       Physical Examination:   General appearance: alert, well appearing, and in no distress  Mental status: alert, oriented to person, place, and time  Skin: warm & dry   Cardiovascular: normal heart rate noted   Respiratory: normal respiratory effort, no  distress   Breasts: deferred, no complaints   Abdomen: soft, non-tender   Pelvic: VULVA: normal appearing vulva with no masses, tenderness or lesions, VAGINA: normal appearing vagina with normal color and discharge, no lesions, CERVIX: normal appearing cervix without discharge or lesions  Rectal: small hemorrhoid  Extremities: no edema       No results found for this or any previous visit (from the past 24 hour(s)).   IUD INSERTION The risks and benefits of the method and placement have been thouroughly reviewed with the patient and all questions were answered.  Specifically the patient is aware of failure rate of 08/998, expulsion of the IUD and of possible perforation.  The patient is aware of irregular bleeding due to the method and understands the incidence of irregular bleeding diminishes with time.  Signed copy of informed consent in chart.   Time out was performed.  A graves speculum was placed in the vagina.  The cervix was visualized, prepped using Betadine, and grasped with a single tooth tenaculum. The uterus was found to be neutral and it sounded to 8 cm.  Paragard  IUD placed per manufacturer's recommendations. The strings were trimmed to approximately 3 cm. The patient tolerated the procedure well.   Informal transvaginal sonogram was performed and the proper placement of the IUD was verified.  Chaperone: AmCelene Squibb  Assessment & Plan:  1) Postpartum exam 2) 6 wks s/p spontaneous vaginal delivery 3) bottle feeding 4) Depression screening 5) Paragard IUD insertion The patient was given post procedure instructions, including signs and symptoms of infection and to check for the strings after each menses or each month, and refraining from intercourse or anything in the vagina for 3 days.  Condoms for 2 weeks. She was given a care card with date IUD placed, and date IUD to be removed.  She is scheduled for a f/u appointment in 4 weeks.  Essential components of care per  ACOG recommendations:  1.  Mood and well being:  . If positive depression screen, discussed and plan developed.  . If using tobacco we discussed reduction/cessation and risk of relapse . If current substance abuse, we discussed and referral to local resources was offered.   2. Infant care and feeding:  . If breastfeeding, discussed returning to work, pumping, breastfeeding-associated pain, guidance regarding return to fertility while lactating if not using another method. If needed, patient was provided with a letter to be allowed to pump q 2-3hrs to support lactation in a private location with access to a refrigerator to store breastmilk.   . Recommended that all caregivers be immunized for flu, pertussis and other preventable communicable  diseases . If pt does not have material needs met for her/baby, referred to local resources for help obtaining these.  3. Sexuality, contraception and birth spacing . Provided guidance regarding sexuality, management of dyspareunia, and resumption of intercourse . Discussed avoiding interpregnancy interval <50mhs and recommended birth spacing of 18 months  4. Sleep and fatigue . Discussed coping options for fatigue and sleep disruption . Encouraged family/partner/community support of 4 hrs of uninterrupted sleep to help with mood and fatigue  5. Physical recovery  . If pt had a C/S, assessed incisional pain and providing guidance on normal vs prolonged recovery . If pt had a laceration, perineal healing and pain reviewed.  . If urinary or fecal incontinence, discussed management and referred to PT or uro/gyn if indicated  . Patient is safe to resume physical activity. Discussed attainment of healthy weight.  6.  Chronic disease management . Discussed pregnancy complications if any, and their implications for future childbearing and long-term maternal health. . Review recommendations for prevention of recurrent pregnancy complications, such as 17  hydroxyprogesterone caproate to reduce risk for recurrent PTB not applicable, or aspirin to reduce risk of preeclampsia yes. . Pt had GDM: No. If yes, 2hr GTT scheduled: not applicable. . Reviewed medications and non-pregnant dosing including consideration of whether pt is breastfeeding using a reliable resource such as LactMed: not applicable . Referred for f/u w/ PCP or subspecialist providers as indicated: not applicable  7. Health maintenance . Mammogram at [facility] earlier if indicated . Pap smears as indicated  Meds: No orders of the defined types were placed in this encounter.   Follow-up: Return in about 4 weeks (around 11/15/2020) for IUD f/u, in person, CNM.   No orders of the defined types were placed in this encounter.   [facility]3/18/2022 11:07 AM

## 2020-11-14 ENCOUNTER — Ambulatory Visit: Payer: 59 | Admitting: Women's Health

## 2021-03-13 ENCOUNTER — Other Ambulatory Visit: Payer: Self-pay

## 2021-07-16 ENCOUNTER — Encounter: Payer: Self-pay | Admitting: Adult Health

## 2021-07-16 ENCOUNTER — Ambulatory Visit (INDEPENDENT_AMBULATORY_CARE_PROVIDER_SITE_OTHER): Payer: Federal, State, Local not specified - PPO | Admitting: Adult Health

## 2021-07-16 ENCOUNTER — Other Ambulatory Visit: Payer: Self-pay

## 2021-07-16 VITALS — BP 139/81 | HR 90 | Ht 73.0 in | Wt 285.2 lb

## 2021-07-16 DIAGNOSIS — Z30431 Encounter for routine checking of intrauterine contraceptive device: Secondary | ICD-10-CM | POA: Diagnosis not present

## 2021-07-16 NOTE — Progress Notes (Signed)
°  Subjective:     Patient ID: Rebecca [name], female   DOB: [birth year], 35 y.o.   MRN: [id number]  HPI Rebecca [name] is a 35 year old white female,married, G2P2 in for IUD check, thinks she had period in November and usually periods regular and heavy, has ParaGard.Has not felt string, did HPT and it was negative.   Lab Results  Component Value Date   DIAGPAP  05/08/2020    - Negative for intraepithelial lesion or malignancy (NILM)   HPV NOT DETECTED 05/03/2017   HPVHIGH Negative 05/08/2020    Review of Systems ?period late, has IUD Reviewed past medical,surgical, social and family history. Reviewed medications and allergies.     Objective:   Physical Exam BP 139/81 (BP Location: Left Arm, Patient Position: Sitting, Cuff Size: Large)    Pulse 90    Ht 6\' 1"  (1.854 m)    Wt 285 lb 3.2 oz (129.4 kg)    LMP 07/16/2021 (Exact Date)    Breastfeeding No    BMI 37.63 kg/m     She could not pee for UPT. Skin warm and dry.Pelvic: external genitalia is normal in appearance no lesions, vagina: +blood in vault,urethra has no lesions or masses noted, cervix:smooth and bulbous, +IUD strings,uterus: normal size, shape and contour, non tender, no masses felt, adnexa: no masses or tenderness noted. Bladder is non tender and no masses felt.  Upstream - 07/16/21 07/18/21       Pregnancy Intention Screening   Does the patient want to become pregnant in the next year? No    Does the patient's partner want to become pregnant in the next year? No    Would the patient like to discuss contraceptive options today? No      Contraception Wrap Up   Current Method IUD or IUS    End Method IUD or IUS    Contraception Counseling Provided No            Examination chaperoned by [id number] RN  Assessment:     1. IUD check up     Plan:     Follow up prn

## 2021-08-15 ENCOUNTER — Encounter: Payer: Self-pay | Admitting: Physician Assistant

## 2021-08-15 ENCOUNTER — Ambulatory Visit: Payer: 59 | Admitting: Physician Assistant

## 2021-08-15 NOTE — Progress Notes (Incomplete)
SCRIBE STATEMENT  Subjective:    Rebecca [name] is a 36 y.o. female and is here to establish care and a comprehensive physical exam.  HPI  Health Maintenance Due  Topic Date Due   COVID-19 Vaccine (2 - Moderna series) 10/01/2020    Acute Concerns: ***  Chronic Issues: Hx of Bariatric Surgery Pt underwent gastric bypass surgery in 2012 with her highest weight being 316 lbs. Currently she is not taking any bariatric supplements. ***  Health Maintenance: Immunizations -- Covid-  UTD Influenza-UTD;2022 Tdap-UTD;2021 PAP -- UTD;2021 Bone Density -- N/A Diet -- *** Sleep habits -- *** Exercise -- *** Current Weight -- *** Weight History: Wt Readings from Last 10 Encounters:  07/16/21 285 lb 3.2 oz (129.4 kg)  10/18/20 263 lb (119.3 kg)  09/13/20 252 lb (114.3 kg)  09/01/20 271 lb (122.9 kg)  08/27/20 272 lb (123.4 kg)  08/20/20 272 lb (123.4 kg)  08/13/20 274 lb (124.3 kg)  07/29/20 272 lb 8 oz (123.6 kg)  07/15/20 274 lb (124.3 kg)  06/24/20 271 lb (122.9 kg)   There is no height or weight on file to calculate BMI. Mood -- ***  Patient's last menstrual period was 07/16/2021 (exact date). Period characteristics -- Normal with heavy flow Birth control -- Paragard IUD    reports no history of alcohol use.  Tobacco Use: Low Risk    Smoking Tobacco Use: Never   Smokeless Tobacco Use: Never   Passive Exposure: Not on file     Depression screen [facility] 2/9 03/07/2020  Decreased Interest 0  Down, Depressed, Hopeless 0  PHQ - 2 Score 0  Altered sleeping 0  Tired, decreased energy 0  Change in appetite 0  Feeling bad or failure about yourself  0  Trouble concentrating 0  Moving slowly or fidgety/restless 0  Suicidal thoughts 0  PHQ-9 Score 0  Difficult doing work/chores -     Other providers/specialists: Patient Care Team: Pcp, No as PCP - General   PMHx, SurgHx, SocialHx, Medications, and Allergies were reviewed in the Visit Navigator and updated as  appropriate.   Past Medical History:  Diagnosis Date   Pregnant 03/29/2017   S/P gastric bypass      Past Surgical History:  Procedure Laterality Date   rny  03/09/2011   ROUX-EN-Y PROCEDURE  03/09/11   TONSILLECTOMY       Family History  Problem Relation Age of Onset   Anxiety disorder Sister    Hypertension Maternal Grandmother    Hypertension Maternal Grandfather    Atrial fibrillation Maternal Grandfather    Breast cancer Maternal Aunt 45       1 Maternal Aunt - Amy    Social History   Tobacco Use   Smoking status: Never   Smokeless tobacco: Never  Vaping Use   Vaping Use: Never used  Substance Use Topics   Alcohol use: No    Alcohol/week: 4.0 standard drinks    Types: 4 Glasses of wine per week    Comment: occ before pregnancy   Drug use: No    Review of Systems:   ROS  Objective:   LMP 07/16/2021 (Exact Date)   General Appearance:    Alert, cooperative, no distress, appears stated age  Head:    Normocephalic, without obvious abnormality, atraumatic  Eyes:    PERRL, conjunctiva/corneas clear, EOM's intact, fundi    benign, both eyes  Ears:    Normal TM's and external ear canals, both ears  Nose:  Nares normal, septum midline, mucosa normal, no drainage    or sinus tenderness  Throat:   Lips, mucosa, and tongue normal; teeth and gums normal  Neck:   Supple, symmetrical, trachea midline, no adenopathy;    thyroid:  no enlargement/tenderness/nodules; no carotid   bruit or JVD  Back:     Symmetric, no curvature, ROM normal, no CVA tenderness  Lungs:     Clear to auscultation bilaterally, respirations unlabored  Chest Wall:    No tenderness or deformity   Heart:    Regular rate and rhythm, S1 and S2 normal, no murmur, rub   or gallop  Breast Exam:    No tenderness, masses, or nipple abnormality  Abdomen:     Soft, non-tender, bowel sounds active all four quadrants,    no masses, no organomegaly  Genitalia:    Normal female without lesion, discharge or  tenderness  Rectal:    Normal tone no masses or tenderness  Extremities:   Extremities normal, atraumatic, no cyanosis or edema  Pulses:   2+ and symmetric all extremities  Skin:   Skin color, texture, turgor normal, no rashes or lesions  Lymph nodes:   Cervical, supraclavicular, and axillary nodes normal  Neurologic:   CNII-XII intact, normal strength, sensation and reflexes    throughout    Assessment/Plan:   @DIAGLIST @    Patient Counseling:   [x]     Nutrition: Stressed importance of moderation in sodium/caffeine intake, saturated fat and cholesterol, caloric balance, sufficient intake of fresh fruits, vegetables, fiber, calcium, iron, and 1 mg of folate supplement per day (for females capable of pregnancy).   [x]      Stressed the importance of regular exercise.    [x]     Substance Abuse: Discussed cessation/primary prevention of tobacco, alcohol, or other drug use; driving or other dangerous activities under the influence; availability of treatment for abuse.    [x]      Injury prevention: Discussed safety belts, safety helmets, smoke detector, smoking near bedding or upholstery.    [x]      Sexuality: Discussed sexually transmitted diseases, partner selection, use of condoms, avoidance of unintended pregnancy  and contraceptive alternatives.    [x]     Dental health: Discussed importance of regular tooth brushing, flossing, and dental visits.   [x]      Health maintenance and immunizations reviewed. Please refer to Health maintenance section.   ***  Inda Coke, PA-C [place]

## 2021-09-26 ENCOUNTER — Encounter: Payer: Self-pay | Admitting: Physician Assistant

## 2021-09-26 ENCOUNTER — Telehealth: Payer: Federal, State, Local not specified - PPO | Admitting: Physician Assistant

## 2021-09-26 DIAGNOSIS — Z20818 Contact with and (suspected) exposure to other bacterial communicable diseases: Secondary | ICD-10-CM | POA: Diagnosis not present

## 2021-09-26 MED ORDER — AMOXICILLIN 500 MG PO TABS: 500.0000 mg | ORAL_TABLET | Freq: Two times a day (BID) | ORAL | 0 refills | Status: DC

## 2021-09-26 NOTE — Progress Notes (Signed)
I have spent 5 minutes in review of e-visit questionnaire, review and updating patient chart, medical decision making and response to patient.   Beena Catano Cody Antonie Borjon, PA-C    

## 2021-09-26 NOTE — Progress Notes (Signed)
E-Visit for Sore Throat - Strep Symptoms  We are sorry that you are not feeling well.  Here is how we plan to help!  Based on what you have shared with me it is likely that you have strep pharyngitis.  Strep pharyngitis is inflammation and infection in the back of the throat.  This is an infection cause by bacteria and is treated with antibiotics.  I have prescribed Amoxicillin 500 mg twice a day for 10 days giving your exposure and that you have fever and body aches without cough or chest congestion that would increase concern for a URI like the flu as well. For throat pain, we recommend over the counter oral pain relief medications such as acetaminophen or aspirin, or anti-inflammatory medications such as ibuprofen or naproxen sodium. Topical treatments such as oral throat lozenges or sprays may be used as needed. Strep infections are not as easily transmitted as other respiratory infections, however we still recommend that you avoid close contact with loved ones, especially the very young and elderly.  Remember to wash your hands thoroughly throughout the day as this is the number one way to prevent the spread of infection and wipe down door knobs and counters with disinfectant.   Home Care: Only take medications as instructed by your medical team. Complete the entire course of an antibiotic. Do not take these medications with alcohol. A steam or ultrasonic humidifier can help congestion.  You can place a towel over your head and breathe in the steam from hot water coming from a faucet. Avoid close contacts especially the very young and the elderly. Cover your mouth when you cough or sneeze. Always remember to wash your hands.  Get Help Right Away If: You develop worsening fever or sinus pain. You develop a severe head ache or visual changes. Your symptoms persist after you have completed your treatment plan.  Make sure you Understand these instructions. Will watch your condition. Will get  help right away if you are not doing well or get worse.   Thank you for choosing an e-visit.  Your e-visit answers were reviewed by a board certified advanced clinical practitioner to complete your personal care plan. Depending upon the condition, your plan could have included both over the counter or prescription medications.  Please review your pharmacy choice. Make sure the pharmacy is open so you can pick up prescription now. If there is a problem, you may contact your provider through [employer] and have the prescription routed to another pharmacy.  Your safety is important to [place]. If you have drug allergies check your prescription carefully.   For the next 24 hours you can use MyChart to ask questions about today's visit, request a non-urgent call back, or ask for a work or school excuse. You will get an email in the next two days asking about your experience. I hope that your e-visit has been valuable and will speed your recovery.

## 2021-10-01 ENCOUNTER — Ambulatory Visit: Payer: Federal, State, Local not specified - PPO | Admitting: Family Medicine

## 2022-06-03 ENCOUNTER — Telehealth: Payer: Federal, State, Local not specified - PPO | Admitting: Physician Assistant

## 2022-06-03 DIAGNOSIS — H109 Unspecified conjunctivitis: Secondary | ICD-10-CM | POA: Diagnosis not present

## 2022-06-03 MED ORDER — POLYMYXIN B-TRIMETHOPRIM 10000-0.1 UNIT/ML-% OP SOLN: 1.0000 [drp] | OPHTHALMIC | 0 refills | Status: DC

## 2022-06-03 NOTE — Progress Notes (Signed)

## 2022-06-19 ENCOUNTER — Encounter: Payer: Self-pay | Admitting: Adult Health

## 2022-06-19 ENCOUNTER — Ambulatory Visit (INDEPENDENT_AMBULATORY_CARE_PROVIDER_SITE_OTHER): Payer: Federal, State, Local not specified - PPO | Admitting: Adult Health

## 2022-06-19 VITALS — BP 138/89 | HR 84 | Ht 73.0 in | Wt 264.0 lb

## 2022-06-19 DIAGNOSIS — F32A Depression, unspecified: Secondary | ICD-10-CM

## 2022-06-19 DIAGNOSIS — F419 Anxiety disorder, unspecified: Secondary | ICD-10-CM

## 2022-06-19 MED ORDER — BUPROPION HCL ER (XL) 150 MG PO TB24: 150.0000 mg | ORAL_TABLET | Freq: Every day | ORAL | 3 refills | Status: DC

## 2022-06-19 NOTE — Progress Notes (Signed)
  Subjective:     Patient ID: Rebecca [name], female   DOB: 16-Jun-1986, 36 y.o.   MRN: [id number]  HPI Rebecca [name] is a 36 year old white female,married, G2P2002 in complaining of ?depression, worries a lot, on edge, quickly irritated, not sleeping well.   Last pap was negative HPV and malignancy 05/08/20.   Review of Systems ?depression, worries a lot, on edge, quickly irritated, not sleeping well.  Decreased libido    Reviewed past medical,surgical, social and family history. Reviewed medications and allergies.  Objective:   Physical Exam BP 138/89 (BP Location: Left Arm, Patient Position: Sitting, Cuff Size: Normal)   Pulse 84   Ht 6\' 1"  (1.854 m)   Wt 264 lb (119.7 kg)   LMP 06/13/2022   Breastfeeding No   BMI 34.83 kg/m     Skin warm and dry.Lungs: clear to ausculation bilaterally. Cardiovascular: regular rate and rhythm. Fall risk is low    06/19/2022   10:44 AM 03/07/2020    3:24 PM 01/04/2020   11:42 AM  Depression screen PHQ 2/9  Decreased Interest 1 0 0  Down, Depressed, Hopeless 1 0 0  PHQ - 2 Score 2 0 0  Altered sleeping 1 0   Tired, decreased energy 1 0   Change in appetite 1 0   Feeling bad or failure about yourself  1 0   Trouble concentrating 1 0   Moving slowly or fidgety/restless 1 0   Suicidal thoughts 0 0   PHQ-9 Score 8 0   Difficult doing work/chores Somewhat difficult          06/19/2022   10:45 AM 03/07/2020    3:26 PM  GAD 7 : Generalized Anxiety Score  Nervous, Anxious, on Edge 3 0  Control/stop worrying 3 0  Worry too much - different things 3 0  Trouble relaxing 3 0  Restless 2 0  Easily annoyed or irritable 3 0  Afraid - awful might happen 3   Total GAD 7 Score 20   Anxiety Difficulty Very difficult       Upstream - 06/19/22 1038       Pregnancy Intention Screening   Does the patient want to become pregnant in the next year? No    Does the patient's partner want to become pregnant in the next year? No    Would the patient like to  discuss contraceptive options today? No      Contraception Wrap Up   Current Method IUD or IUS    End Method IUD or IUS    Contraception Counseling Provided No             Assessment:      1. Anxiety and depression ?depression, worries a lot, on edge, quickly irritated, not sleeping well.  Decreased libido   Will try Wellbutrin Meds ordered this encounter  Medications   buPROPion (WELLBUTRIN XL) 150 MG 24 hr tablet    Sig: Take 1 tablet (150 mg total) by mouth daily.    Dispense:  30 tablet    Refill:  3    Order Specific Question:   Supervising Provider    Answer:   06/21/22 H [[id number]]   Take time for self  Plan:     Follow up in 6 weeks for ROS

## 2022-07-31 ENCOUNTER — Ambulatory Visit (INDEPENDENT_AMBULATORY_CARE_PROVIDER_SITE_OTHER): Payer: Federal, State, Local not specified - PPO | Admitting: Adult Health

## 2022-07-31 ENCOUNTER — Encounter: Payer: Self-pay | Admitting: Adult Health

## 2022-07-31 VITALS — BP 132/89 | HR 86 | Ht 73.0 in | Wt 271.0 lb

## 2022-07-31 DIAGNOSIS — F32A Depression, unspecified: Secondary | ICD-10-CM | POA: Diagnosis not present

## 2022-07-31 DIAGNOSIS — F419 Anxiety disorder, unspecified: Secondary | ICD-10-CM | POA: Diagnosis not present

## 2022-07-31 MED ORDER — BUPROPION HCL ER (XL) 300 MG PO TB24: 300.0000 mg | ORAL_TABLET | ORAL | 2 refills | Status: DC

## 2022-07-31 NOTE — Progress Notes (Signed)
  Subjective:     Patient ID: Rebecca [name], female   DOB: January 19, 1986, 36 y.o.   MRN: [id number]  HPI Rebecca [name] is a 36 year old white female, married, G2P2002, back in follow up on starting Wellbutrin and is much better but would like to try higher dose. She is going to [employer] in 3 weeks.  Last pap was negative HPV and malignancy 05/08/20.  Review of Systems Feels better, less anxious  Reviewed past medical,surgical, social and family history. Reviewed medications and allergies.     Objective:   Physical Exam BP 132/89 (BP Location: Left Arm, Patient Position: Sitting, Cuff Size: Normal)   Pulse 86   Ht 6\' 1"  (1.854 m)   Wt 271 lb (122.9 kg)   BMI 35.75 kg/m  Skin warm and dry.  Lungs: clear to ausculation bilaterally. Cardiovascular: regular rate and rhythm.    Fall risk is low    07/31/2022   10:55 AM 06/19/2022   10:44 AM 03/07/2020    3:24 PM  Depression screen PHQ 2/9  Decreased Interest 0 1 0  Down, Depressed, Hopeless 0 1 0  PHQ - 2 Score 0 2 0  Altered sleeping 1 1 0  Tired, decreased energy 0 1 0  Change in appetite 0 1 0  Feeling bad or failure about yourself  0 1 0  Trouble concentrating 0 1 0  Moving slowly or fidgety/restless 0 1 0  Suicidal thoughts 0 0 0  PHQ-9 Score 1 8 0  Difficult doing work/chores  Somewhat difficult        07/31/2022   10:57 AM 06/19/2022   10:45 AM 03/07/2020    3:26 PM  GAD 7 : Generalized Anxiety Score  Nervous, Anxious, on Edge 1 3 0  Control/stop worrying 0 3 0  Worry too much - different things 0 3 0  Trouble relaxing 1 3 0  Restless 0 2 0  Easily annoyed or irritable 0 3 0  Afraid - awful might happen 0 3   Total GAD 7 Score 2 20   Anxiety Difficulty  Very difficult     Upstream - 07/31/22 1055       Pregnancy Intention Screening   Does the patient want to become pregnant in the next year? No    Does the patient's partner want to become pregnant in the next year? No    Would the patient like to discuss contraceptive  options today? No      Contraception Wrap Up   Current Method IUD or IUS    End Method IUD or IUS    Contraception Counseling Provided No               Assessment:     1. Anxiety and depression Feels better but but would like to try higher dose, will increase to Wellbutrin XL 300 mg 1 daily Meds ordered this encounter  Medications   buPROPion (WELLBUTRIN XL) 300 MG 24 hr tablet    Sig: Take 1 tablet (300 mg total) by mouth every morning.    Dispense:  30 tablet    Refill:  2    Order Specific Question:   Supervising Provider    Answer:   08/02/22 [[id number]]       Plan:     Follow up in 8 weeks for ROS

## 2022-09-25 ENCOUNTER — Encounter: Payer: Self-pay | Admitting: Adult Health

## 2022-09-25 ENCOUNTER — Ambulatory Visit (INDEPENDENT_AMBULATORY_CARE_PROVIDER_SITE_OTHER): Payer: Federal, State, Local not specified - PPO | Admitting: Adult Health

## 2022-09-25 VITALS — BP 131/81 | HR 78 | Ht 73.0 in | Wt 268.5 lb

## 2022-09-25 DIAGNOSIS — F32A Depression, unspecified: Secondary | ICD-10-CM

## 2022-09-25 DIAGNOSIS — F419 Anxiety disorder, unspecified: Secondary | ICD-10-CM | POA: Diagnosis not present

## 2022-09-25 MED ORDER — BUPROPION HCL ER (XL) 300 MG PO TB24: 300.0000 mg | ORAL_TABLET | ORAL | 2 refills | Status: DC

## 2022-09-25 NOTE — Progress Notes (Signed)
  Subjective:     Patient ID: Rebecca [name], female   DOB: January 30, 1986, 37 y.o.   MRN: [id number]  HPI Rebecca [name] is a 37 year old white female,married, G2P2002, back in follow up on Wellbutrin and doing great. Has new job in [place] at [employer].  Last pap was negative HPV and NILM 05/08/20. Review of Systems Doing great, no anxiety or depression Reviewed past medical,surgical, social and family history. Reviewed medications and allergies.     Objective:   Physical Exam BP 131/81 (BP Location: Left Arm, Patient Position: Sitting, Cuff Size: Large)   Pulse 78   Ht 6' 1"$  (1.854 m)   Wt 268 lb 8 oz (121.8 kg)   LMP 09/04/2022 (Approximate)   BMI 35.42 kg/m  Skin warm and dry. Lungs: clear to ausculation bilaterally. Cardiovascular: regular rate and rhythm.    Fall risk is low    09/25/2022   11:56 AM 07/31/2022   10:55 AM 06/19/2022   10:44 AM  Depression screen PHQ 2/9  Decreased Interest 0 0 1  Down, Depressed, Hopeless 0 0 1  PHQ - 2 Score 0 0 2  Altered sleeping 0 1 1  Tired, decreased energy 0 0 1  Change in appetite 0 0 1  Feeling bad or failure about yourself  0 0 1  Trouble concentrating 0 0 1  Moving slowly or fidgety/restless 0 0 1  Suicidal thoughts 0 0 0  PHQ-9 Score 0 1 8  Difficult doing work/chores   Somewhat difficult       09/25/2022   11:57 AM 07/31/2022   10:57 AM 06/19/2022   10:45 AM 03/07/2020    3:26 PM  GAD 7 : Generalized Anxiety Score  Nervous, Anxious, on Edge 0 1 3 0  Control/stop worrying 0 0 3 0  Worry too much - different things 0 0 3 0  Trouble relaxing 0 1 3 0  Restless 0 0 2 0  Easily annoyed or irritable 0 0 3 0  Afraid - awful might happen 0 0 3   Total GAD 7 Score 0 2 20   Anxiety Difficulty   Very difficult     Upstream - 09/25/22 1156       Pregnancy Intention Screening   Does the patient want to become pregnant in the next year? No    Does the patient's partner want to become pregnant in the next year? No    Would the patient like to  discuss contraceptive options today? No      Contraception Wrap Up   Current Method IUD or IUS    End Method IUD or IUS    Contraception Counseling Provided No               Assessment:     1. Anxiety and depression Doing great Will refill Wellbutrin Meds ordered this encounter  Medications   buPROPion (WELLBUTRIN XL) 300 MG 24 hr tablet    Sig: Take 1 tablet (300 mg total) by mouth every morning.    Dispense:  90 tablet    Refill:  2    Order Specific Question:   Supervising Provider    Answer:   Florian Buff [[id number]]       Plan:     Return in about 8 months for pap and physical or before if needed

## 2023-09-02 ENCOUNTER — Other Ambulatory Visit (HOSPITAL_COMMUNITY)
Admission: RE | Admit: 2023-09-02 | Discharge: 2023-09-02 | Disposition: A | Discharge status: Home / Self Care | Payer: BC Managed Care – PPO | Source: Ambulatory Visit | Attending: Adult Health | Admitting: Adult Health

## 2023-09-02 ENCOUNTER — Encounter: Payer: Self-pay | Admitting: Adult Health

## 2023-09-02 ENCOUNTER — Ambulatory Visit: Payer: BC Managed Care – PPO | Admitting: Adult Health

## 2023-09-02 VITALS — BP 137/83 | HR 78 | Ht 73.0 in | Wt 285.0 lb

## 2023-09-02 DIAGNOSIS — N920 Excessive and frequent menstruation with regular cycle: Secondary | ICD-10-CM | POA: Diagnosis not present

## 2023-09-02 DIAGNOSIS — Z9884 Bariatric surgery status: Secondary | ICD-10-CM | POA: Diagnosis not present

## 2023-09-02 DIAGNOSIS — Z01419 Encounter for gynecological examination (general) (routine) without abnormal findings: Secondary | ICD-10-CM | POA: Insufficient documentation

## 2023-09-02 DIAGNOSIS — Z30432 Encounter for removal of intrauterine contraceptive device: Secondary | ICD-10-CM | POA: Insufficient documentation

## 2023-09-02 NOTE — Progress Notes (Signed)
Patient ID: Rebecca [name], female   DOB: October 25, 1985, 38 y.o.   MRN: [id number] History of Present Illness: Rebecca [name] is a 38 year old white female, married, G2P2002, in for a well woman gyn exam and pap. She request IUD removal due to heavy periods with ParaGard.   Current Medications, Allergies, Past Medical History, Past Surgical History, Family History and Social History were reviewed in [employer] record.     Review of Systems: Patient denies any headaches, hearing loss, fatigue, blurred vision, shortness of breath, chest pain, abdominal pain, problems with bowel movements, urination, or intercourse. No joint pain or mood swings.  Heavy periods with ParaGard    Physical Exam:BP 137/83 (BP Location: Left Arm, Patient Position: Sitting, Cuff Size: Large)   Pulse 78   Ht 6\' 1"  (1.854 m)   Wt 285 lb (129.3 kg)   LMP 08/02/2023 (Approximate)   BMI 37.60 kg/m  Consent signed for IUD removal and time out called. 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  The vagina is normal in appearance. Urethra has no lesions or masses. The cervix is bulbous, pap with HR HPV genotyping performed, IUD strings at os, grasped strings with forceps and asked pt to cough and IUD easily removed.  Uterus is felt to be normal size, shape, and contour.  No adnexal masses or tenderness noted.Bladder is non tender, no masses felt. Extremities/musculoskeletal:  No swelling or varicosities noted, no clubbing or cyanosis Psych:  No mood changes, alert and cooperative,seems happy AA is 1 Fall risk is  low    09/02/2023    2:32 PM 09/25/2022   11:56 AM 07/31/2022   10:55 AM  Depression screen PHQ 2/9  Decreased Interest 0 0 0  Down, Depressed, Hopeless 0 0 0  PHQ - 2 Score 0 0 0  Altered sleeping 0 0 1  Tired, decreased energy 0 0 0  Change in appetite 0 0 0  Feeling bad or failure about yourself  0 0 0  Trouble concentrating 0 0 0  Moving slowly or fidgety/restless 0 0 0  Suicidal thoughts 0 0 0  PHQ-9 Score 0 0 1       09/02/2023    2:32 PM 09/25/2022   11:57 AM 07/31/2022   10:57 AM 06/19/2022   10:45 AM  GAD 7 : Generalized Anxiety Score  Nervous, Anxious, on Edge 0 0 1 3  Control/stop worrying 0 0 0 3  Worry too much - different things 0 0 0 3  Trouble relaxing 0 0 1 3  Restless 0 0 0 2  Easily annoyed or irritable 0 0 0 3  Afraid - awful might happen 0 0 0 3  Total GAD 7 Score 0 0 2 20  Anxiety Difficulty    Very difficult      Upstream - 09/02/23 1431       Pregnancy Intention Screening   Does the patient want to become pregnant in the next year? Unsure    Does the patient's partner want to become pregnant in the next year? Unsure    Would the patient like to discuss contraceptive options today? No  Contraception Wrap Up   Current Method IUD or IUS    End Method Female Condom    Contraception Counseling Provided Yes    How was the end contraceptive method provided? N/A             Examination chaperoned by Malachy Mood LPN  Impression and Plan: 1. Encounter for gynecological examination with Papanicolaou smear of cervix (Primary) Pap sent Pap in 3 years if normal Physical in 1 year Declines labs today - Cytology - PAP( [place])  2. Encounter for IUD removal IUD removed   3. S/P gastric bypass  4. Menorrhagia with regular cycle Periods heavy with ParaGard, IUD removed  Will see how periods are and let me know

## 2023-09-08 LAB — CYTOLOGY - PAP
Comment: NEGATIVE
High risk HPV: NEGATIVE

## 2023-09-10 ENCOUNTER — Encounter: Payer: Self-pay | Admitting: Adult Health

## 2023-09-10 DIAGNOSIS — R87612 Low grade squamous intraepithelial lesion on cytologic smear of cervix (LGSIL): Secondary | ICD-10-CM | POA: Insufficient documentation

## 2023-10-27 ENCOUNTER — Telehealth: Admitting: Physician Assistant

## 2023-10-27 DIAGNOSIS — H109 Unspecified conjunctivitis: Secondary | ICD-10-CM

## 2023-10-27 MED ORDER — POLYMYXIN B-TRIMETHOPRIM 10000-0.1 UNIT/ML-% OP SOLN
1.0000 [drp] | OPHTHALMIC | 0 refills | Status: AC
Start: 2023-10-27 — End: 2023-11-01

## 2023-10-27 NOTE — Progress Notes (Signed)
 E-Visit for [employer]   We are sorry that you are not feeling well.  Here is how we plan to help!  Based on what you have shared with me it looks like you have conjunctivitis.  Conjunctivitis is a common inflammatory or infectious condition of the eye that is often referred to as "pink eye".  In most cases it is contagious (viral or bacterial). However, not all conjunctivitis requires antibiotics (ex. Allergic).  We have made appropriate suggestions for you based upon your presentation.  I have prescribed Polytrim Ophthalmic drops 1-2 drops 4 times a day times 5 days  Pink eye can be highly contagious.  It is typically spread through direct contact with secretions, or contaminated objects or surfaces that one may have touched.  Strict handwashing is suggested with soap and water is urged.  If not available, use alcohol based had sanitizer.  Avoid unnecessary touching of the eye.  If you wear contact lenses, you will need to refrain from wearing them until you see no white discharge from the eye for at least 24 hours after being on medication.  You should see symptom improvement in 1-2 days after starting the medication regimen.  Call us if symptoms are not improved in 1-2 days.  Home Care: Wash your hands often! Do not wear your contacts until you complete your treatment plan. Avoid sharing towels, bed linen, personal items with a person who has pink eye. See attention for anyone in your home with similar symptoms.  Get Help Right Away If: Your symptoms do not improve. You develop blurred or loss of vision. Your symptoms worsen (increased discharge, pain or redness)   Thank you for choosing an e-visit.  Your e-visit answers were reviewed by a board certified advanced clinical practitioner to complete your personal care plan. Depending upon the condition, your plan could have included both over the counter or prescription medications.  Please review your pharmacy choice. Make sure the  pharmacy is open so you can pick up prescription now. If there is a problem, you may contact your provider through [employer] and have the prescription routed to another pharmacy.  Your safety is important to [place]. If you have drug allergies check your prescription carefully.   For the next 24 hours you can use MyChart to ask questions about today's visit, request a non-urgent call back, or ask for a work or school excuse. You will get an email in the next two days asking about your experience. I hope that your e-visit has been valuable and will speed your recovery.  I have spent 5 minutes in review of e-visit questionnaire, review and updating patient chart, medical decision making and response to patient.   Margaretann Loveless, PA-C

## 2024-01-14 DIAGNOSIS — F4322 Adjustment disorder with anxiety: Secondary | ICD-10-CM | POA: Diagnosis not present

## 2024-01-24 DIAGNOSIS — F411 Generalized anxiety disorder: Secondary | ICD-10-CM | POA: Diagnosis not present

## 2024-02-02 DIAGNOSIS — F411 Generalized anxiety disorder: Secondary | ICD-10-CM | POA: Diagnosis not present

## 2024-02-10 DIAGNOSIS — F411 Generalized anxiety disorder: Secondary | ICD-10-CM | POA: Diagnosis not present

## 2024-02-17 DIAGNOSIS — F411 Generalized anxiety disorder: Secondary | ICD-10-CM | POA: Diagnosis not present

## 2024-03-01 DIAGNOSIS — F411 Generalized anxiety disorder: Secondary | ICD-10-CM | POA: Diagnosis not present

## 2024-03-07 DIAGNOSIS — F411 Generalized anxiety disorder: Secondary | ICD-10-CM | POA: Diagnosis not present

## 2024-03-15 DIAGNOSIS — F411 Generalized anxiety disorder: Secondary | ICD-10-CM | POA: Diagnosis not present

## 2024-03-23 DIAGNOSIS — F411 Generalized anxiety disorder: Secondary | ICD-10-CM | POA: Diagnosis not present

## 2024-04-12 DIAGNOSIS — M79671 Pain in right foot: Secondary | ICD-10-CM | POA: Diagnosis not present

## 2024-04-13 DIAGNOSIS — F411 Generalized anxiety disorder: Secondary | ICD-10-CM | POA: Diagnosis not present

## 2024-05-18 DIAGNOSIS — F411 Generalized anxiety disorder: Secondary | ICD-10-CM | POA: Diagnosis not present

## 2024-06-14 DIAGNOSIS — F411 Generalized anxiety disorder: Secondary | ICD-10-CM | POA: Diagnosis not present

## 2024-07-21 DIAGNOSIS — F411 Generalized anxiety disorder: Secondary | ICD-10-CM | POA: Diagnosis not present
